# Patient Record
Sex: Female | Born: 1975 | Race: Black or African American | Hispanic: No | Smoking: Never smoker
Health system: Southern US, Community
[De-identification: ages and names within clinical notes are randomized; demographics above are authoritative.]

## PROBLEM LIST (undated history)

## (undated) ENCOUNTER — Inpatient Hospital Stay (HOSPITAL_COMMUNITY): Payer: Self-pay

## (undated) DIAGNOSIS — K219 Gastro-esophageal reflux disease without esophagitis: Secondary | ICD-10-CM

## (undated) DIAGNOSIS — G43909 Migraine, unspecified, not intractable, without status migrainosus: Secondary | ICD-10-CM

## (undated) DIAGNOSIS — F410 Panic disorder [episodic paroxysmal anxiety] without agoraphobia: Secondary | ICD-10-CM

## (undated) DIAGNOSIS — B009 Herpesviral infection, unspecified: Secondary | ICD-10-CM

## (undated) DIAGNOSIS — D649 Anemia, unspecified: Secondary | ICD-10-CM

## (undated) DIAGNOSIS — J3081 Allergic rhinitis due to animal (cat) (dog) hair and dander: Secondary | ICD-10-CM

## (undated) DIAGNOSIS — J40 Bronchitis, not specified as acute or chronic: Secondary | ICD-10-CM

## (undated) DIAGNOSIS — F32A Depression, unspecified: Secondary | ICD-10-CM

## (undated) DIAGNOSIS — D491 Neoplasm of unspecified behavior of respiratory system: Secondary | ICD-10-CM

## (undated) DIAGNOSIS — Z9109 Other allergy status, other than to drugs and biological substances: Secondary | ICD-10-CM

## (undated) HISTORY — DX: Depression, unspecified: F32.A

## (undated) HISTORY — DX: Other allergy status, other than to drugs and biological substances: Z91.09

## (undated) HISTORY — DX: Migraine, unspecified, not intractable, without status migrainosus: G43.909

## (undated) HISTORY — DX: Neoplasm of unspecified behavior of respiratory system: D49.1

## (undated) HISTORY — DX: Gastro-esophageal reflux disease without esophagitis: K21.9

## (undated) HISTORY — DX: Allergic rhinitis due to animal (cat) (dog) hair and dander: J30.81

---

## 2009-01-12 DIAGNOSIS — D491 Neoplasm of unspecified behavior of respiratory system: Secondary | ICD-10-CM

## 2009-01-12 HISTORY — PX: TUMOR REMOVAL: SHX12

## 2009-01-12 HISTORY — DX: Neoplasm of unspecified behavior of respiratory system: D49.1

## 2011-06-23 ENCOUNTER — Emergency Department (HOSPITAL_COMMUNITY)
Admission: EM | Admit: 2011-06-23 | Discharge: 2011-06-24 | Disposition: A | Discharge status: Home / Self Care | Payer: Self-pay | Attending: Emergency Medicine | Admitting: Emergency Medicine

## 2011-06-23 ENCOUNTER — Encounter (HOSPITAL_COMMUNITY): Payer: Self-pay | Admitting: *Deleted

## 2011-06-23 DIAGNOSIS — R10819 Abdominal tenderness, unspecified site: Secondary | ICD-10-CM | POA: Insufficient documentation

## 2011-06-23 DIAGNOSIS — K5900 Constipation, unspecified: Secondary | ICD-10-CM | POA: Insufficient documentation

## 2011-06-23 DIAGNOSIS — D649 Anemia, unspecified: Secondary | ICD-10-CM | POA: Insufficient documentation

## 2011-06-23 DIAGNOSIS — R109 Unspecified abdominal pain: Secondary | ICD-10-CM | POA: Insufficient documentation

## 2011-06-23 DIAGNOSIS — K625 Hemorrhage of anus and rectum: Secondary | ICD-10-CM | POA: Insufficient documentation

## 2011-06-23 HISTORY — DX: Anemia, unspecified: D64.9

## 2011-06-23 NOTE — ED Notes (Signed)
Pt reports dark red blood noted in stools - pt reports moderate/large amt noted x4 days. Pt admits to LUQ abd pain. Pt in no acute distress at present. Pt reports recent hx of constipation states last episode lasted x3 weeks.

## 2011-06-24 ENCOUNTER — Emergency Department (HOSPITAL_COMMUNITY): Payer: Self-pay

## 2011-06-24 ENCOUNTER — Encounter (HOSPITAL_COMMUNITY): Payer: Self-pay | Admitting: Radiology

## 2011-06-24 LAB — COMPREHENSIVE METABOLIC PANEL
Alkaline Phosphatase: 65 U/L (ref 39–117)
BUN: 12 mg/dL (ref 6–23)
Chloride: 103 mEq/L (ref 96–112)
Creatinine, Ser: 0.74 mg/dL (ref 0.50–1.10)
GFR calc Af Amer: >90 mL/min (ref >90)
Glucose, Bld: 99 mg/dL (ref 70–99)
Potassium: 3.6 mEq/L (ref 3.5–5.1)
Total Bilirubin: 0.2 mg/dL — ABNORMAL LOW (ref 0.3–1.2)
Total Protein: 7.7 g/dL (ref 6.0–8.3)

## 2011-06-24 LAB — CBC
MCH: 25.1 pg — ABNORMAL LOW (ref 26.0–34.0)
MCHC: 31.9 g/dL (ref 30.0–36.0)
MCV: 78.8 fL (ref 78.0–100.0)
Platelets: 257 10*3/uL (ref 150–400)
RBC: 4.1 MIL/uL (ref 3.87–5.11)

## 2011-06-24 LAB — URINALYSIS, ROUTINE W REFLEX MICROSCOPIC
Ketones, ur: NEGATIVE mg/dL
Leukocytes, UA: NEGATIVE
Nitrite: NEGATIVE
Protein, ur: NEGATIVE mg/dL

## 2011-06-24 LAB — DIFFERENTIAL
Basophils Relative: 0 % (ref 0–1)
Eosinophils Absolute: 0.1 10*3/uL (ref 0.0–0.7)
Eosinophils Relative: 2 % (ref 0–5)
Lymphs Abs: 3.3 10*3/uL (ref 0.7–4.0)
Neutrophils Relative %: 44 % (ref 43–77)

## 2011-06-24 LAB — APTT: aPTT: 32 seconds (ref 24–37)

## 2011-06-24 MED ORDER — POLYETHYLENE GLYCOL 3350 17 GM/SCOOP PO POWD: 17.0000 g | Freq: Two times a day (BID) | ORAL | Status: AC

## 2011-06-24 MED ORDER — OXYCODONE-ACETAMINOPHEN 5-325 MG PO TABS
1.0000 | ORAL_TABLET | Freq: Once | ORAL | Status: AC
  Administered 2011-06-24: 1 via ORAL
  Filled 2011-06-24: qty 1

## 2011-06-24 MED ORDER — IOHEXOL 300 MG/ML  SOLN
100.0000 mL | Freq: Once | INTRAMUSCULAR | Status: AC | PRN
  Administered 2011-06-24: 100 mL via INTRAVENOUS

## 2011-06-24 MED ORDER — MAGNESIUM CITRATE PO SOLN: 296.0000 mL | Freq: Once | ORAL | Status: AC

## 2011-06-24 MED ORDER — DOCUSATE SODIUM 100 MG PO CAPS: 100.0000 mg | ORAL_CAPSULE | Freq: Two times a day (BID) | ORAL | Status: AC

## 2011-06-24 NOTE — ED Notes (Signed)
Returned from CT.

## 2011-06-24 NOTE — ED Provider Notes (Signed)
History     CSN: [id number]  Arrival date & time 06/23/11  2213   First MD Initiated Contact with Patient 06/24/11 [phone]      Chief Complaint  Patient presents with  . Abdominal Pain  . Rectal Bleeding    (Consider location/radiation/quality/duration/timing/severity/associated sxs/prior treatment) HPI Comments: 36 year old female with no significant past medical history who presents with 4 days of left lower quadrant pain and rectal bleeding. She states that she has pain with bowel movements which is mild to moderate, minimal pain when not having a bowel movement. She denies a history of hemorrhoids, anticoagulation, trauma or anal penetration. Symptoms are intermittent, daily, nothing makes better or worse, no associated fevers chills nausea or vomiting. He has no dysuria, vaginal  Patient is a 36 y.o. female presenting with abdominal pain and hematochezia. The history is provided by the patient and a relative.  Abdominal Pain The primary symptoms of the illness include abdominal pain and hematochezia.  Rectal Bleeding  Associated symptoms include abdominal pain.    Past Medical History  Diagnosis Date  . Anemia     Past Surgical History  Procedure Date  . Tumor removal     No family history on file.  History  Substance Use Topics  . Smoking status: Never Smoker   . Smokeless tobacco: Not on file  . Alcohol Use: No    OB History    Grav Para Term Preterm Abortions TAB SAB Ect Mult Living                  Review of Systems  Gastrointestinal: Positive for abdominal pain and hematochezia.  All other systems reviewed and are negative.    Allergies  Review of patient's allergies indicates no known allergies.  Home Medications   Current Outpatient Rx  Name Route Sig Dispense Refill  . ONE-A-DAY WOMENS FORMULA PO Oral Take 1 tablet by mouth daily.    Marland Kitchen DOCUSATE SODIUM 100 MG PO CAPS Oral Take 1 capsule (100 mg total) by mouth every 12 (twelve) hours. 30 capsule  0  . MAGNESIUM CITRATE PO SOLN Oral Take 296 mLs by mouth once. OTC 300 mL 0  . POLYETHYLENE GLYCOL 3350 PO POWD Oral Take 17 g by mouth 2 (two) times daily. Until daily soft stools  OTC 255 g 0    BP 121/81  Pulse 67  Temp(Src) 98.7 F (37.1 C) (Oral)  Resp 18  SpO2 100%  LMP 06/14/2011  Physical Exam  Nursing note and vitals reviewed. Constitutional: She appears well-developed and well-nourished. No distress.  HENT:  Head: Normocephalic and atraumatic.  Mouth/Throat: Oropharynx is clear and moist. No oropharyngeal exudate.  Eyes: Conjunctivae and EOM are normal. Pupils are equal, round, and reactive to light. Right eye exhibits no discharge. Left eye exhibits no discharge. No scleral icterus.  Neck: Normal range of motion. Neck supple. No JVD present. No thyromegaly present.  Cardiovascular: Normal rate, regular rhythm, normal heart sounds and intact distal pulses.  Exam reveals no gallop and no friction rub.   No murmur heard. Pulmonary/Chest: Effort normal and breath sounds normal. No respiratory distress. She has no wheezes. She has no rales.  Abdominal: Soft. Bowel sounds are normal. She exhibits no distension and no mass. There is tenderness ( Mild tenderness to the left upper and lower quadrants. No right lower quadrant tenderness, no right upper quadrant tenderness. Non-peritoneal).  Genitourinary:       Chaperone present for rectal exam, no external or internal hemorrhoids, no  anal fissure, no masses felt. Scant stool in the rectal vault, no gross blood and Hemoccult  Musculoskeletal: Normal range of motion. She exhibits no edema and no tenderness.  Lymphadenopathy:    She has no cervical adenopathy.  Neurological: She is alert. Coordination normal.  Skin: Skin is warm and dry. No rash noted. No erythema.  Psychiatric: She has a normal mood and affect. Her behavior is normal.    ED Course  Procedures (including critical care time)  Labs Reviewed  CBC - Abnormal;  Notable for the following:    Hemoglobin 10.3 (*)    HCT 32.3 (*)    MCH 25.1 (*)    All other components within normal limits  DIFFERENTIAL - Abnormal; Notable for the following:    Lymphocytes Relative 47 (*)    All other components within normal limits  COMPREHENSIVE METABOLIC PANEL - Abnormal; Notable for the following:    Total Bilirubin 0.2 (*)    All other components within normal limits  APTT  PROTIME-INR  URINALYSIS, ROUTINE W REFLEX MICROSCOPIC  PREGNANCY, URINE  OCCULT BLOOD, POC DEVICE  OCCULT BLOOD X 1 CARD TO LAB, STOOL   Ct Abdomen Pelvis W Contrast  06/24/2011  *RADIOLOGY REPORT*  Clinical Data: Abdominal pain.  GI bleeding.  Rectal bleeding.  CT ABDOMEN AND PELVIS WITH CONTRAST  Technique:  Multidetector CT imaging of the abdomen and pelvis was performed following the standard protocol during bolus administration of intravenous contrast.  Contrast: OMNIPAQUE IOHEXOL 300 MG/ML IV SOLN  Comparison: None.  Findings: Lung bases clear.  Liver appears within normal limits. Gallbladder is mostly decompressed.  No inflammatory changes of the gallbladder.  Spleen normal.  Pancreas normal.  Common bile duct normal.  Abdominal vasculature appears within normal limits. Normal renal enhancement.  There is ectasia of the right upper pole renal collecting system and a prominent column of Bertin.  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  Delayed renal images were not performed per protocol.  No renal calculi.  Normal renal enhancement.  Small bowel appears within normal limits.  Normal appendix in the right lower quadrant.  Large stool burden is present in the ascending, transverse and proximal descending colon.  Sigmoid colon appears mostly decompressed.  Prom prominent veins are present in the anatomic pelvis around the uterus which could be associated with pelvic congestion syndrome.  IUD is present within the uterus. Urinary  bladder appears normal.  Scattered calcified phleboliths. No adenopathy is identified.  Bones appear within normal limits.  IMPRESSION: 1.  Large right-sided stool burden.  Normal appendix. 2.  IUD present within the uterus. 3.  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  No definite obstruction.  Original Report Authenticated By: Andreas Newport, M.D.     1. Abdominal pain   2. Anemia   3. Constipation       MDM  Left-sided pain with report of bleeding though no evidence of bleeding on exam. Vital signs unremarkable. Blood work and CT scan pending.  CT scan shows large stool burden, CBC shows mild anemia. I discussed the signs with the patient and encourage her to followup closely. Hemoccult testing was negative for blood. Referrals to        Vida Roller, MD 06/24/11 [phone]

## 2012-04-06 ENCOUNTER — Encounter: Payer: Self-pay | Admitting: Family Medicine

## 2012-06-17 ENCOUNTER — Encounter (HOSPITAL_COMMUNITY): Payer: Self-pay | Admitting: Emergency Medicine

## 2012-06-17 ENCOUNTER — Emergency Department (HOSPITAL_COMMUNITY)
Admission: EM | Admit: 2012-06-17 | Discharge: 2012-06-17 | Disposition: A | Discharge status: Home / Self Care | Payer: BC Managed Care – PPO | Attending: Emergency Medicine | Admitting: Emergency Medicine

## 2012-06-17 DIAGNOSIS — J029 Acute pharyngitis, unspecified: Secondary | ICD-10-CM | POA: Insufficient documentation

## 2012-06-17 DIAGNOSIS — Z862 Personal history of diseases of the blood and blood-forming organs and certain disorders involving the immune mechanism: Secondary | ICD-10-CM | POA: Insufficient documentation

## 2012-06-17 DIAGNOSIS — R630 Anorexia: Secondary | ICD-10-CM | POA: Insufficient documentation

## 2012-06-17 DIAGNOSIS — R6883 Chills (without fever): Secondary | ICD-10-CM | POA: Insufficient documentation

## 2012-06-17 DIAGNOSIS — R112 Nausea with vomiting, unspecified: Secondary | ICD-10-CM | POA: Insufficient documentation

## 2012-06-17 DIAGNOSIS — J111 Influenza due to unidentified influenza virus with other respiratory manifestations: Secondary | ICD-10-CM

## 2012-06-17 DIAGNOSIS — R52 Pain, unspecified: Secondary | ICD-10-CM | POA: Insufficient documentation

## 2012-06-17 DIAGNOSIS — Z8709 Personal history of other diseases of the respiratory system: Secondary | ICD-10-CM | POA: Insufficient documentation

## 2012-06-17 HISTORY — DX: Bronchitis, not specified as acute or chronic: J40

## 2012-06-17 MED ORDER — ACETAMINOPHEN-CODEINE 120-12 MG/5ML PO SOLN: 10.0000 mL | ORAL | Status: DC | PRN

## 2012-06-17 MED ORDER — IBUPROFEN 800 MG PO TABS: 800.0000 mg | ORAL_TABLET | Freq: Three times a day (TID) | ORAL | Status: DC | PRN

## 2012-06-17 MED ORDER — GUAIFENESIN ER 1200 MG PO TB12: 1.0000 | ORAL_TABLET | Freq: Two times a day (BID) | ORAL | Status: DC

## 2012-06-17 NOTE — ED Provider Notes (Signed)
History  This chart was scribed for Ebbie Ridge, PA-C working with Ward Givens, MD by Shari Heritage, ED Scribe. This patient was seen in room WTR5/WTR5 and the patient's care was started at 1502.   CSN: [id number]  Arrival date & time 06/17/12  1402   First MD Initiated Contact with Patient 06/17/12 1502      Chief Complaint  Patient presents with  . Sore Throat    painful swollowing  . Cough    productive cough  . Generalized Body Aches     The history is provided by the patient. No language interpreter was used.    HPI Comments: Dawn Gray is a 37 y.o. female who presents to the Emergency Department complaining of moderate to severe, constant, dull sore throat pain and productive cough onset 1 day ago. Patient states that cough is producing clear sputum. There is associated nausea, generalized body aches, chills, lightheadedness, emesis (x2), and decreased appetite.  Patient denies diarrhea, abdominal pain, dizziness, or blurred vision. Patient hasn't taken any medicines at home for symptom relief. Patient says that she has only been able to drink water and that other fluids hurt her throat. Patient is not allergic to any medicines. She does not take any regular medicines at home. Patient has a medical history of anemia and bronchitis.   PCP - Rana Snare   Past Medical History  Diagnosis Date  . Anemia   . Bronchitis     Past Surgical History  Procedure Date  . Tumor removal     Family History  Problem Relation Age of Onset  . Hypertension Mother   . Diabetes Mother   . Cancer Father   . Diabetes Father   . Hypertension Father     History  Substance Use Topics  . Smoking status: Never Smoker   . Smokeless tobacco: Not on file  . Alcohol Use: No    OB History    Grav Para Term Preterm Abortions TAB SAB Ect Mult Living                  Review of Systems All other systems negative except as documented in the HPI. All pertinent positives and negatives as reviewed  in the HPI.  Allergies  Review of patient's allergies indicates no known allergies.  Home Medications   Current Outpatient Rx  Name  Route  Sig  Dispense  Refill  . ONE-A-DAY WOMENS FORMULA PO   Oral   Take 1 tablet by mouth daily.           Triage Vtials: BP 118/75  Pulse 96  Temp 98.1 F (36.7 C) (Oral)  Resp 18  SpO2 99%  LMP 06/13/2012  Physical Exam  Constitutional: She is oriented to person, place, and time. She appears well-developed and well-nourished. No distress.  HENT:  Head: Normocephalic and atraumatic.  Mouth/Throat: No oropharyngeal exudate.  Eyes: Conjunctivae normal are normal.  Neck: Neck supple.  Cardiovascular: Normal rate and regular rhythm.   No murmur heard. Pulmonary/Chest: Effort normal and breath sounds normal. No respiratory distress. She has no wheezes. She has no rales.  Abdominal: She exhibits no distension.  Lymphadenopathy:    She has cervical adenopathy.  Neurological: She is alert and oriented to person, place, and time.  Skin: Skin is warm. No rash noted.  Psychiatric: She has a normal mood and affect. Her behavior is normal.    ED Course  Procedures (including critical care time) DIAGNOSTIC STUDIES: Oxygen Saturation is 99%  on room air, normal by my interpretation.    COORDINATION OF CARE: 3:26 PM- Patient's here with sore throat cough and other flu-like symptoms. Patient's symptoms are consistent with flu-like illness. Upon exam, lungs were clear suggesting that patient does not have any pneumonia. Recommend that patient take medicines for symptom relief and let virus run its course. Patient should return for any worsening symptoms. The patient is advised to increase her fluid intake and rest as much as possible.     MDM  I personally performed the services described in this documentation, which was scribed in my presence. The recorded information has been reviewed and is accurate.   Carlyle Dolly, PA-C 06/17/12  1549

## 2012-06-17 NOTE — ED Provider Notes (Signed)
Medical screening examination/treatment/procedure(s) were performed by non-physician practitioner and as supervising physician I was immediately available for consultation/collaboration. Devoria Albe, MD, Arm[facility] Gang   Ward Givens, MD 06/17/12 [phone]

## 2012-06-17 NOTE — ED Notes (Signed)
Pt reports generalized aches, poor appetite, cough -with emesis x2.. Sore throat x 3 days

## 2012-07-13 ENCOUNTER — Encounter: Payer: Self-pay | Admitting: Family Medicine

## 2012-07-13 ENCOUNTER — Other Ambulatory Visit (HOSPITAL_COMMUNITY)
Admission: RE | Admit: 2012-07-13 | Discharge: 2012-07-13 | Disposition: A | Discharge status: Home / Self Care | Payer: BC Managed Care – PPO | Source: Ambulatory Visit | Attending: Family Medicine | Admitting: Family Medicine

## 2012-07-13 ENCOUNTER — Ambulatory Visit (INDEPENDENT_AMBULATORY_CARE_PROVIDER_SITE_OTHER): Payer: BC Managed Care – PPO | Admitting: Family Medicine

## 2012-07-13 VITALS — BP 90/60 | HR 91 | Temp 98.4°F | Ht 63.0 in | Wt 144.8 lb

## 2012-07-13 DIAGNOSIS — Z0000 Encounter for general adult medical examination without abnormal findings: Secondary | ICD-10-CM

## 2012-07-13 DIAGNOSIS — Z01419 Encounter for gynecological examination (general) (routine) without abnormal findings: Secondary | ICD-10-CM | POA: Insufficient documentation

## 2012-07-13 DIAGNOSIS — K5900 Constipation, unspecified: Secondary | ICD-10-CM

## 2012-07-13 DIAGNOSIS — K5909 Other constipation: Secondary | ICD-10-CM | POA: Insufficient documentation

## 2012-07-13 LAB — BASIC METABOLIC PANEL
BUN: 15 mg/dL (ref 6–23)
Calcium: 9.6 mg/dL (ref 8.4–10.5)
Creatinine, Ser: 0.8 mg/dL (ref 0.4–1.2)
GFR: 106.85 mL/min (ref >60.00)
Glucose, Bld: 92 mg/dL (ref 70–99)

## 2012-07-13 LAB — CBC WITH DIFFERENTIAL/PLATELET
Basophils Absolute: 0 10*3/uL (ref 0.0–0.1)
Lymphocytes Relative: 29 % (ref 12.0–46.0)
Lymphs Abs: 1.9 10*3/uL (ref 0.7–4.0)
Monocytes Relative: 4.9 % (ref 3.0–12.0)
Neutrophils Relative %: 62.6 % (ref 43.0–77.0)
Platelets: 270 10*3/uL (ref 150.0–400.0)
RDW: 14.3 % (ref 11.5–14.6)

## 2012-07-13 LAB — LIPID PANEL
Cholesterol: 173 mg/dL (ref 0–200)
LDL Cholesterol: 111 mg/dL — ABNORMAL HIGH (ref 0–99)
VLDL: 15.4 mg/dL (ref 0.0–40.0)

## 2012-07-13 LAB — TSH: TSH: 2.24 u[IU]/mL (ref 0.35–5.50)

## 2012-07-13 LAB — HEPATIC FUNCTION PANEL
AST: 15 U/L (ref 0–37)
Alkaline Phosphatase: 61 U/L (ref 39–117)
Total Bilirubin: 0.6 mg/dL (ref 0.3–1.2)

## 2012-07-13 MED ORDER — LINACLOTIDE 145 MCG PO CAPS: 145.0000 ug | ORAL_CAPSULE | Freq: Every day | ORAL | Status: DC

## 2012-07-13 NOTE — Assessment & Plan Note (Addendum)
GI referral Pt has tried multiple otc with no relief----trial Linzess 145 1 po qd

## 2012-07-13 NOTE — Patient Instructions (Addendum)
Preventive Care for Adults, Female A healthy lifestyle and preventive care can promote health and wellness. Preventive health guidelines for women include the following key practices.  A routine yearly physical is a good way to check with your caregiver about your health and preventive screening. It is a chance to share any concerns and updates on your health, and to receive a thorough exam.  Visit your dentist for a routine exam and preventive care every 6 months. Brush your teeth twice a day and floss once a day. Good oral hygiene prevents tooth decay and gum disease.  The frequency of eye exams is based on your age, health, family medical history, use of contact lenses, and other factors. Follow your caregiver's recommendations for frequency of eye exams.  Eat a healthy diet. Foods like vegetables, fruits, whole grains, low-fat dairy products, and lean protein foods contain the nutrients you need without too many calories. Decrease your intake of foods high in solid fats, added sugars, and salt. Eat the right amount of calories for you.Get information about a proper diet from your caregiver, if necessary.  Regular physical exercise is one of the most important things you can do for your health. Most adults should get at least 150 minutes of moderate-intensity exercise (any activity that increases your heart rate and causes you to sweat) each week. In addition, most adults need muscle-strengthening exercises on 2 or more days a week.  Maintain a healthy weight. The body mass index (BMI) is a screening tool to identify possible weight problems. It provides an estimate of body fat based on height and weight. Your caregiver can help determine your BMI, and can help you achieve or maintain a healthy weight.For adults 20 years and older:  A BMI below 18.5 is considered underweight.  A BMI of 18.5 to 24.9 is normal.  A BMI of 25 to 29.9 is considered overweight.  A BMI of 30 and above is  considered obese.  Maintain normal blood lipids and cholesterol levels by exercising and minimizing your intake of saturated fat. Eat a balanced diet with plenty of fruit and vegetables. Blood tests for lipids and cholesterol should begin at age 20 and be repeated every 5 years. If your lipid or cholesterol levels are high, you are over 50, or you are at high risk for heart disease, you may need your cholesterol levels checked more frequently.Ongoing high lipid and cholesterol levels should be treated with medicines if diet and exercise are not effective.  If you smoke, find out from your caregiver how to quit. If you do not use tobacco, do not start.  If you are pregnant, do not drink alcohol. If you are breastfeeding, be very cautious about drinking alcohol. If you are not pregnant and choose to drink alcohol, do not exceed 1 drink per day. One drink is considered to be 12 ounces (355 mL) of beer, 5 ounces (148 mL) of wine, or 1.5 ounces (44 mL) of liquor.  Avoid use of street drugs. Do not share needles with anyone. Ask for help if you need support or instructions about stopping the use of drugs.  High blood pressure causes heart disease and increases the risk of stroke. Your blood pressure should be checked at least every 1 to 2 years. Ongoing high blood pressure should be treated with medicines if weight loss and exercise are not effective.  If you are 55 to 35 years old, ask your caregiver if you should take aspirin to prevent strokes.  Diabetes   screening involves taking a blood sample to check your fasting blood sugar level. This should be done once every 3 years, after age 45, if you are within normal weight and without risk factors for diabetes. Testing should be considered at a younger age or be carried out more frequently if you are overweight and have at least 1 risk factor for diabetes.  Breast cancer screening is essential preventive care for women. You should practice "breast  self-awareness." This means understanding the normal appearance and feel of your breasts and may include breast self-examination. Any changes detected, no matter how small, should be reported to a caregiver. Women in their 20s and 30s should have a clinical breast exam (CBE) by a caregiver as part of a regular health exam every 1 to 3 years. After age 40, women should have a CBE every year. Starting at age 40, women should consider having a mammography (breast X-ray test) every year. Women who have a family history of breast cancer should talk to their caregiver about genetic screening. Women at a high risk of breast cancer should talk to their caregivers about having magnetic resonance imaging (MRI) and a mammography every year.  The Pap test is a screening test for cervical cancer. A Pap test can show cell changes on the cervix that might become cervical cancer if left untreated. A Pap test is a procedure in which cells are obtained and examined from the lower end of the uterus (cervix).  Women should have a Pap test starting at age 21.  Between ages 21 and 29, Pap tests should be repeated every 2 years.  Beginning at age 30, you should have a Pap test every 3 years as long as the past 3 Pap tests have been normal.  Some women have medical problems that increase the chance of getting cervical cancer. Talk to your caregiver about these problems. It is especially important to talk to your caregiver if a new problem develops soon after your last Pap test. In these cases, your caregiver may recommend more frequent screening and Pap tests.  The above recommendations are the same for women who have or have not gotten the vaccine for human papillomavirus (HPV).  If you had a hysterectomy for a problem that was not cancer or a condition that could lead to cancer, then you no longer need Pap tests. Even if you no longer need a Pap test, a regular exam is a good idea to make sure no other problems are  starting.  If you are between ages 65 and 70, and you have had normal Pap tests going back 10 years, you no longer need Pap tests. Even if you no longer need a Pap test, a regular exam is a good idea to make sure no other problems are starting.  If you have had past treatment for cervical cancer or a condition that could lead to cancer, you need Pap tests and screening for cancer for at least 20 years after your treatment.  If Pap tests have been discontinued, risk factors (such as a new sexual partner) need to be reassessed to determine if screening should be resumed.  The HPV test is an additional test that may be used for cervical cancer screening. The HPV test looks for the virus that can cause the cell changes on the cervix. The cells collected during the Pap test can be tested for HPV. The HPV test could be used to screen women aged 30 years and older, and should   be used in women of any age who have unclear Pap test results. After the age of 30, women should have HPV testing at the same frequency as a Pap test.  Colorectal cancer can be detected and often prevented. Most routine colorectal cancer screening begins at the age of 50 and continues through age 75. However, your caregiver may recommend screening at an earlier age if you have risk factors for colon cancer. On a yearly basis, your caregiver may provide home test kits to check for hidden blood in the stool. Use of a small camera at the end of a tube, to directly examine the colon (sigmoidoscopy or colonoscopy), can detect the earliest forms of colorectal cancer. Talk to your caregiver about this at age 50, when routine screening begins. Direct examination of the colon should be repeated every 5 to 10 years through age 75, unless early forms of pre-cancerous polyps or small growths are found.  Hepatitis C blood testing is recommended for all people born from 1945 through 1965 and any individual with known risks for hepatitis C.  Practice  safe sex. Use condoms and avoid high-risk sexual practices to reduce the spread of sexually transmitted infections (STIs). STIs include gonorrhea, chlamydia, syphilis, trichomonas, herpes, HPV, and human immunodeficiency virus (HIV). Herpes, HIV, and HPV are viral illnesses that have no cure. They can result in disability, cancer, and death. Sexually active women aged 25 and younger should be checked for chlamydia. Older women with new or multiple partners should also be tested for chlamydia. Testing for other STIs is recommended if you are sexually active and at increased risk.  Osteoporosis is a disease in which the bones lose minerals and strength with aging. This can result in serious bone fractures. The risk of osteoporosis can be identified using a bone density scan. Women ages 65 and over and women at risk for fractures or osteoporosis should discuss screening with their caregivers. Ask your caregiver whether you should take a calcium supplement or vitamin D to reduce the rate of osteoporosis.  Menopause can be associated with physical symptoms and risks. Hormone replacement therapy is available to decrease symptoms and risks. You should talk to your caregiver about whether hormone replacement therapy is right for you.  Use sunscreen with sun protection factor (SPF) of 30 or more. Apply sunscreen liberally and repeatedly throughout the day. You should seek shade when your shadow is shorter than you. Protect yourself by wearing long sleeves, pants, a wide-brimmed hat, and sunglasses year round, whenever you are outdoors.  Once a month, do a whole body skin exam, using a mirror to look at the skin on your back. Notify your caregiver of new moles, moles that have irregular borders, moles that are larger than a pencil eraser, or moles that have changed in shape or color.  Stay current with required immunizations.  Influenza. You need a dose every fall (or winter). The composition of the flu vaccine  changes each year, so being vaccinated once is not enough.  Pneumococcal polysaccharide. You need 1 to 2 doses if you smoke cigarettes or if you have certain chronic medical conditions. You need 1 dose at age 65 (or older) if you have never been vaccinated.  Tetanus, diphtheria, pertussis (Tdap, Td). Get 1 dose of Tdap vaccine if you are younger than age 65, are over 65 and have contact with an infant, are a healthcare worker, are pregnant, or simply want to be protected from whooping cough. After that, you need a Td   booster dose every 10 years. Consult your caregiver if you have not had at least 3 tetanus and diphtheria-containing shots sometime in your life or have a deep or dirty wound.  HPV. You need this vaccine if you are a woman age 26 or younger. The vaccine is given in 3 doses over 6 months.  Measles, mumps, rubella (MMR). You need at least 1 dose of MMR if you were born in 1957 or later. You may also need a second dose.  Meningococcal. If you are age 19 to 21 and a first-year college student living in a residence hall, or have one of several medical conditions, you need to get vaccinated against meningococcal disease. You may also need additional booster doses.  Zoster (shingles). If you are age 60 or older, you should get this vaccine.  Varicella (chickenpox). If you have never had chickenpox or you were vaccinated but received only 1 dose, talk to your caregiver to find out if you need this vaccine.  Hepatitis A. You need this vaccine if you have a specific risk factor for hepatitis A virus infection or you simply wish to be protected from this disease. The vaccine is usually given as 2 doses, 6 to 18 months apart.  Hepatitis B. You need this vaccine if you have a specific risk factor for hepatitis B virus infection or you simply wish to be protected from this disease. The vaccine is given in 3 doses, usually over 6 months. Preventive Services / Frequency Ages 19 to 39  Blood  pressure check.** / Every 1 to 2 years.  Lipid and cholesterol check.** / Every 5 years beginning at age 20.  Clinical breast exam.** / Every 3 years for women in their 20s and 30s.  Pap test.** / Every 2 years from ages 21 through 29. Every 3 years starting at age 30 through age 65 or 70 with a history of 3 consecutive normal Pap tests.  HPV screening.** / Every 3 years from ages 30 through ages 65 to 70 with a history of 3 consecutive normal Pap tests.  Hepatitis C blood test.** / For any individual with known risks for hepatitis C.  Skin self-exam. / Monthly.  Influenza immunization.** / Every year.  Pneumococcal polysaccharide immunization.** / 1 to 2 doses if you smoke cigarettes or if you have certain chronic medical conditions.  Tetanus, diphtheria, pertussis (Tdap, Td) immunization. / A one-time dose of Tdap vaccine. After that, you need a Td booster dose every 10 years.  HPV immunization. / 3 doses over 6 months, if you are 26 and younger.  Measles, mumps, rubella (MMR) immunization. / You need at least 1 dose of MMR if you were born in 1957 or later. You may also need a second dose.  Meningococcal immunization. / 1 dose if you are age 19 to 21 and a first-year college student living in a residence hall, or have one of several medical conditions, you need to get vaccinated against meningococcal disease. You may also need additional booster doses.  Varicella immunization.** / Consult your caregiver.  Hepatitis A immunization.** / Consult your caregiver. 2 doses, 6 to 18 months apart.  Hepatitis B immunization.** / Consult your caregiver. 3 doses usually over 6 months. Ages 40 to 64  Blood pressure check.** / Every 1 to 2 years.  Lipid and cholesterol check.** / Every 5 years beginning at age 20.  Clinical breast exam.** / Every year after age 40.  Mammogram.** / Every year beginning at age 40   and continuing for as long as you are in good health. Consult with your  caregiver.  Pap test.** / Every 3 years starting at age 30 through age 65 or 70 with a history of 3 consecutive normal Pap tests.  HPV screening.** / Every 3 years from ages 30 through ages 65 to 70 with a history of 3 consecutive normal Pap tests.  Fecal occult blood test (FOBT) of stool. / Every year beginning at age 50 and continuing until age 75. You may not need to do this test if you get a colonoscopy every 10 years.  Flexible sigmoidoscopy or colonoscopy.** / Every 5 years for a flexible sigmoidoscopy or every 10 years for a colonoscopy beginning at age 50 and continuing until age 75.  Hepatitis C blood test.** / For all people born from 1945 through 1965 and any individual with known risks for hepatitis C.  Skin self-exam. / Monthly.  Influenza immunization.** / Every year.  Pneumococcal polysaccharide immunization.** / 1 to 2 doses if you smoke cigarettes or if you have certain chronic medical conditions.  Tetanus, diphtheria, pertussis (Tdap, Td) immunization.** / A one-time dose of Tdap vaccine. After that, you need a Td booster dose every 10 years.  Measles, mumps, rubella (MMR) immunization. / You need at least 1 dose of MMR if you were born in 1957 or later. You may also need a second dose.  Varicella immunization.** / Consult your caregiver.  Meningococcal immunization.** / Consult your caregiver.  Hepatitis A immunization.** / Consult your caregiver. 2 doses, 6 to 18 months apart.  Hepatitis B immunization.** / Consult your caregiver. 3 doses, usually over 6 months. Ages 65 and over  Blood pressure check.** / Every 1 to 2 years.  Lipid and cholesterol check.** / Every 5 years beginning at age 20.  Clinical breast exam.** / Every year after age 40.  Mammogram.** / Every year beginning at age 40 and continuing for as long as you are in good health. Consult with your caregiver.  Pap test.** / Every 3 years starting at age 30 through age 65 or 70 with a 3  consecutive normal Pap tests. Testing can be stopped between 65 and 70 with 3 consecutive normal Pap tests and no abnormal Pap or HPV tests in the past 10 years.  HPV screening.** / Every 3 years from ages 30 through ages 65 or 70 with a history of 3 consecutive normal Pap tests. Testing can be stopped between 65 and 70 with 3 consecutive normal Pap tests and no abnormal Pap or HPV tests in the past 10 years.  Fecal occult blood test (FOBT) of stool. / Every year beginning at age 50 and continuing until age 75. You may not need to do this test if you get a colonoscopy every 10 years.  Flexible sigmoidoscopy or colonoscopy.** / Every 5 years for a flexible sigmoidoscopy or every 10 years for a colonoscopy beginning at age 50 and continuing until age 75.  Hepatitis C blood test.** / For all people born from 1945 through 1965 and any individual with known risks for hepatitis C.  Osteoporosis screening.** / A one-time screening for women ages 65 and over and women at risk for fractures or osteoporosis.  Skin self-exam. / Monthly.  Influenza immunization.** / Every year.  Pneumococcal polysaccharide immunization.** / 1 dose at age 65 (or older) if you have never been vaccinated.  Tetanus, diphtheria, pertussis (Tdap, Td) immunization. / A one-time dose of Tdap vaccine if you are over   65 and have contact with an infant, are a Research scientist (physical sciences), or simply want to be protected from whooping cough. After that, you need a Td booster dose every 10 years.  Varicella immunization.** / Consult your caregiver.  Meningococcal immunization.** / Consult your caregiver.  Hepatitis A immunization.** / Consult your caregiver. 2 doses, 6 to 18 months apart.  Hepatitis B immunization.** / Check with your caregiver. 3 doses, usually over 6 months. ** Family history and personal history of risk and conditions may change your caregiver's recommendations. Document Released: 07/27/2001 Document Revised: 08/23/2011  Document Reviewed: 10/26/2010 [facility] Patient Information 2013 [place], [place].  Constipation, Adult Constipation is when a person has fewer than 3 bowel movements a week; has difficulty having a bowel movement; or has stools that are dry, hard, or larger than normal. As people grow older, constipation is more common. If you try to fix constipation with medicines that make you have a bowel movement (laxatives), the problem may get worse. Long-term laxative use may cause the muscles of the colon to become weak. A low-fiber diet, not taking in enough fluids, and taking certain medicines may make constipation worse. CAUSES   Certain medicines, such as antidepressants, pain medicine, iron supplements, antacids, and water pills.   Certain diseases, such as diabetes, irritable bowel syndrome (IBS), thyroid disease, or depression.   Not drinking enough water.   Not eating enough fiber-rich foods.   Stress or travel.  Lack of physical activity or exercise.  Not going to the restroom when there is the urge to have a bowel movement.  Ignoring the urge to have a bowel movement.  Using laxatives too much. SYMPTOMS   Having fewer than 3 bowel movements a week.   Straining to have a bowel movement.   Having hard, dry, or larger than normal stools.   Feeling full or bloated.   Pain in the lower abdomen.  Not feeling relief after having a bowel movement. DIAGNOSIS  Your caregiver will take a medical history and perform a physical exam. Further testing may be done for severe constipation. Some tests may include:   A barium enema X-ray to examine your rectum, colon, and sometimes, your small intestine.  A sigmoidoscopy to examine your lower colon.  A colonoscopy to examine your entire colon. TREATMENT  Treatment will depend on the severity of your constipation and what is causing it. Some dietary treatments include drinking more fluids and eating more fiber-rich foods. Lifestyle  treatments may include regular exercise. If these diet and lifestyle recommendations do not help, your caregiver may recommend taking over-the-counter laxative medicines to help you have bowel movements. Prescription medicines may be prescribed if over-the-counter medicines do not work.  HOME CARE INSTRUCTIONS   Increase dietary fiber in your diet, such as fruits, vegetables, whole grains, and beans. Limit high-fat and processed sugars in your diet, such as [place] fries, hamburgers, cookies, candies, and soda.   A fiber supplement may be added to your diet if you cannot get enough fiber from foods.   Drink enough fluids to keep your urine clear or pale yellow.   Exercise regularly or as directed by your caregiver.   Go to the restroom when you have the urge to go. Do not hold it.  Only take medicines as directed by your caregiver. Do not take other medicines for constipation without talking to your caregiver first. SEEK IMMEDIATE MEDICAL CARE IF:   You have bright red blood in your stool.   Your constipation lasts for  more than 4 days or gets worse.   You have abdominal or rectal pain.   You have thin, pencil-like stools.  You have unexplained weight loss. MAKE SURE YOU:   Understand these instructions.  Will watch your condition.  Will get help right away if you are not doing well or get worse. Document Released: 02/27/2004 Document Revised: 08/23/2011 Document Reviewed: 05/04/2011 [facility] Patient Information 2013 [place], [place].

## 2012-07-13 NOTE — Progress Notes (Signed)
Subjective:     Dawn Gray is a 37 y.o. female and is here for a comprehensive physical exam. The patient reports problems - chronic constipation.--- x a few years.    She has used several otc remedies with no relief.  --- including stool softeners, prune juice , miralax, mag citrate, etc.      History   Social History  . Marital Status: Single    Spouse Name: N/A    Number of Children: N/A  . Years of Education: N/A   Occupational History  . Not on file.   Social History Main Topics  . Smoking status: Never Smoker   . Smokeless tobacco: Not on file  . Alcohol Use: No  . Drug Use: No  . Sexually Active: Yes -- Female partner(s)    Birth Control/ Protection: IUD   Other Topics Concern  . Not on file   Social History Narrative  . No narrative on file   Health Maintenance  Topic Date Due  . Pap Smear  01/28/1996  . Tetanus/tdap  01/27/1997  . Influenza Vaccine  02/13/2012    The following portions of the patient's history were reviewed and updated as appropriate:  She  has a past medical history of Anemia; Bronchitis; GERD (gastroesophageal reflux disease); Environmental allergies; Cat allergies; Migraine; and Lung tumor (01/2009). She  does not have a problem list on file. She  has past surgical history that includes Tumor removal (01/2009). Her family history includes Arthritis in her mother and paternal grandmother; Cancer in her maternal grandfather and paternal grandmother; Diabetes in her father and mother; Luiz Blare' disease in her father; Heart disease in her maternal uncle; Hyperlipidemia in her father, maternal grandfather, paternal grandfather, and paternal grandmother; Hypertension in her father and mother; and Stroke in her maternal grandmother and paternal grandmother. She  reports that she has never smoked. She does not have any smokeless tobacco history on file. She reports that she does not drink alcohol or use illicit drugs. She currently has no medications in their  medication list. No current outpatient prescriptions on file prior to visit.   She  has no known allergies..  Review of Systems Review of Systems  Constitutional: Negative for activity change, appetite change and fatigue.  HENT: Negative for hearing loss, congestion, tinnitus and ear discharge.  dentist q35m Eyes: Negative for visual disturbance (see optho q1y -- vision corrected to 20/20 with glasses).  Respiratory: Negative for cough, chest tightness and shortness of breath.   Cardiovascular: Negative for chest pain, palpitations and leg swelling.  Gastrointestinal: Negative for abdominal pain,  + constipation Genitourinary: Negative for urgency, frequency, decreased urine volume and difficulty urinating.  Musculoskeletal: Negative for back pain, arthralgias and gait problem.  Skin: Negative for color change, pallor and rash.  Neurological: Negative for dizziness, light-headedness, numbness and headaches.  Hematological: Negative for adenopathy. Does not bruise/bleed easily.  Psychiatric/Behavioral: Negative for suicidal ideas, confusion, sleep disturbance, self-injury, dysphoric mood, decreased concentration and agitation.       Objective:    BP 90/60  Pulse 91  Temp 98.4 F (36.9 C) (Oral)  Ht 5\' 3"  (1.6 m)  Wt 144 lb 12.8 oz (65.681 kg)  BMI 25.65 kg/m2  SpO2 99%  LMP 07/06/2012 General appearance: alert, cooperative, appears stated age and no distress Head: Normocephalic, without obvious abnormality, atraumatic Eyes: conjunctivae/corneas clear. PERRL, EOM's intact. Fundi benign. Ears: normal TM's and external ear canals both ears Nose: Nares normal. Septum midline. Mucosa normal. No drainage or sinus  tenderness. Throat: lips, mucosa, and tongue normal; teeth and gums normal Neck: no adenopathy, no carotid bruit, supple, symmetrical, trachea midline and thyroid not enlarged, symmetric, no tenderness/mass/nodules Back: symmetric, no curvature. ROM normal. No CVA  tenderness. Lungs: clear to auscultation bilaterally Breasts: normal appearance, no masses or tenderness Heart: regular rate and rhythm, S1, S2 normal, no murmur, click, rub or gallop Abdomen: soft, non-tender; bowel sounds normal; no masses,  no organomegaly Pelvic: cervix normal in appearance, external genitalia normal, no adnexal masses or tenderness, no cervical motion tenderness, rectovaginal septum normal, uterus normal size, shape, and consistency and vagina normal without discharge-- pap done,  IUD string visualized Extremities: extremities normal, atraumatic, no cyanosis or edema Pulses: 2+ and symmetric Skin: Skin color, texture, turgor normal. No rashes or lesions Lymph nodes: Cervical, supraclavicular, and axillary nodes normal. Neurologic: Alert and oriented X 3, normal strength and tone. Normal symmetric reflexes. Normal coordination and gait psych-- no depression, no anxiety   rectal-- heme neg brown stool Assessment:    Healthy female exam.      Plan:    ghm utd Check labs See After Visit Summary for Counseling Recommendations

## 2012-07-14 ENCOUNTER — Encounter: Payer: Self-pay | Admitting: Gastroenterology

## 2012-07-14 LAB — POCT URINALYSIS DIPSTICK
Blood, UA: NEGATIVE
Protein, UA: NEGATIVE
Spec Grav, UA: >=1.03
Urobilinogen, UA: 0.2

## 2012-07-18 ENCOUNTER — Encounter: Payer: Self-pay | Admitting: Gastroenterology

## 2012-07-18 ENCOUNTER — Ambulatory Visit (INDEPENDENT_AMBULATORY_CARE_PROVIDER_SITE_OTHER): Payer: BC Managed Care – PPO | Admitting: Gastroenterology

## 2012-07-18 VITALS — BP 110/68 | HR 62 | Ht 63.0 in | Wt 144.8 lb

## 2012-07-18 DIAGNOSIS — K625 Hemorrhage of anus and rectum: Secondary | ICD-10-CM

## 2012-07-18 DIAGNOSIS — R109 Unspecified abdominal pain: Secondary | ICD-10-CM

## 2012-07-18 DIAGNOSIS — K5900 Constipation, unspecified: Secondary | ICD-10-CM

## 2012-07-18 MED ORDER — MOVIPREP 100 G PO SOLR: 1.0000 | Freq: Once | ORAL | Status: DC

## 2012-07-18 NOTE — Patient Instructions (Signed)
You have been scheduled for a colonoscopy with propofol. Please follow written instructions given to you at your visit today.  Please pick up your prep kit at the pharmacy within the next 1-3 days. If you use inhalers (even only as needed) or a CPAP machine, please bring them with you on the day of your procedure.  

## 2012-07-18 NOTE — Progress Notes (Signed)
History of Present Illness:  This is a 37 year old African American female with 2 years of worsening constipation requiring multiple laxatives to have a bowel movement.  She denies abdominal pain but does have rather frequent rectal bleeding.  She had a CT scan of her abdomen one year ago which was unremarkable except for large volume of stool in her colon.  She is no associated dysphasia or bladder emptying problems or other neuromuscular columns.  She has tried MiraLax, magnesium citrate, and recently Linzess 145 mcg a day without improvement.  Family history is noncontributory.  Review of labs in her chart shows no other abnormalities.  I have reviewed this patient's present history, medical and surgical past history, allergies and medications.     ROS:   All systems were reviewed and are negative unless otherwise stated in the HPI.    Physical Exam:blood pressure 90/60, pulse 90 and regular, and BMI 25.65 with a weight of 144. General well developed well nourished patient in no acute distress, appearing their stated age Eyes PERRLA, no icterus, fundoscopic exam per opthamologist Skin no lesions noted Neck supple, no adenopathy, no thyroid enlargement, no tenderness Chest clear to percussion and auscultation Heart no significant murmurs, gallops or rubs noted Abdomen no hepatosplenomegaly masses or tenderness, BS normal.  Rectal inspection normal no fissures, or fistulae noted.  No masses or tenderness on digital exam. Stool guaiac negative.rectal squeeze pressure appears normal. Extremities no acute joint lesions, edema, phlebitis or evidence of cellulitis. Neurologic patient oriented x 3, cranial nerves intact, no focal neurologic deficits noted. Psychological mental status normal and normal affect.  Assessment and plan:worsening chronic functional constipation in a 37 year old patient without other serious medical problems.  She appears to have some probable hemorrhoidal bleeding.  I've  schedule her for colonoscopy with standard prep and we'll treat her constipation as indicated.  She may need Amitiza in addition to regular MiraLax, Perdiem with senna, and other laxatives.  After colonoscopy I may consider Sitz marker study to be sure that she does not have normal transit constipation.  Is no evidence of endocrine dysfunction reason to suspect colonic obstruction.  She denies menstrual irregularities or probability of pregnancy.  No diagnosis found.

## 2012-07-19 ENCOUNTER — Encounter: Payer: Self-pay | Admitting: Gastroenterology

## 2012-07-19 ENCOUNTER — Ambulatory Visit (AMBULATORY_SURGERY_CENTER): Payer: BC Managed Care – PPO | Admitting: Gastroenterology

## 2012-07-19 VITALS — BP 110/69 | HR 70 | Temp 97.7°F | Resp 17 | Ht 63.0 in | Wt 144.0 lb

## 2012-07-19 DIAGNOSIS — K625 Hemorrhage of anus and rectum: Secondary | ICD-10-CM

## 2012-07-19 DIAGNOSIS — K648 Other hemorrhoids: Secondary | ICD-10-CM

## 2012-07-19 DIAGNOSIS — K5900 Constipation, unspecified: Secondary | ICD-10-CM

## 2012-07-19 DIAGNOSIS — D1330 Benign neoplasm of unspecified part of small intestine: Secondary | ICD-10-CM

## 2012-07-19 DIAGNOSIS — R109 Unspecified abdominal pain: Secondary | ICD-10-CM

## 2012-07-19 DIAGNOSIS — K5909 Other constipation: Secondary | ICD-10-CM

## 2012-07-19 DIAGNOSIS — K5901 Slow transit constipation: Secondary | ICD-10-CM

## 2012-07-19 MED ORDER — SODIUM CHLORIDE 0.9 % IV SOLN: 500.0000 mL | INTRAVENOUS | Status: DC

## 2012-07-19 NOTE — Op Note (Signed)
[facility] [address] [place], [address]   COLONOSCOPY PROCEDURE REPORT  PATIENT: Dawn Gray, Dawn Gray  MR#: [id number] BIRTHDATE: [birth year] , 36  yrs. old GENDER: Female ENDOSCOPIST: Mardella Layman, MD, [facility] REFERRED BY: PROCEDURE DATE:  07/19/2012 PROCEDURE:   Colonoscopy with biopsy ASA CLASS:   Class I INDICATIONS:Change in bowel habits and Constipation. MEDICATIONS: propofol (Diprivan) 400mg  IV  DESCRIPTION OF PROCEDURE:   After the risks and benefits and of the procedure were explained, informed consent was obtained.  A digital rectal exam revealed no abnormalities of the rectum.    The LB CF-[id number]  endoscope was introduced through the anus and advanced to the terminal ileum which was intubated for a short distance .  The quality of the prep was good, using MoviPrep .  The instrument was then slowly withdrawn as the colon was fully examined.     COLON FINDINGS: The mucosa appeared normal in the terminal ileum. Multiple biopsies were performed.   A normal appearing cecum, ileocecal valve, and appendiceal orifice were identified.  The ascending, hepatic flexure, transverse, splenic flexure, descending, sigmoid colon and rectum appeared unremarkable.  No polyps or cancers were seen.     Retroflexed views revealed internal hemorrhoids.     The scope was then withdrawn from the patient and the procedure completed.  COMPLICATIONS: There were no complications. ENDOSCOPIC IMPRESSION: 1.   Normal mucosa in the terminal ileum; multiple biopsies were performed ...r/o ileitis 2.   Normal colon ..no polyps or obstruction  RECOMMENDATIONS: Await biopsy results trial of Amitiza 24 mcg bid and qhs miralax Ov 1 month..?? Sitz markersl  REPEAT EXAM:  cc:  _______________________________ eSignedMardella Layman, MD, [facility] 07/19/2012 4:00 PM

## 2012-07-19 NOTE — Patient Instructions (Addendum)
YOU HAD AN ENDOSCOPIC PROCEDURE TODAY AT THE [facility]: Refer to the procedure report that was given to you for any specific questions about what was found during the examination.  If the procedure report does not answer your questions, please call your gastroenterologist to clarify.  If you requested that your care partner not be given the details of your procedure findings, then the procedure report has been included in a sealed envelope for you to review at your convenience later.  YOU SHOULD EXPECT: Some feelings of bloating in the abdomen. Passage of more gas than usual.  Walking can help get rid of the air that was put into your GI tract during the procedure and reduce the bloating. If you had a lower endoscopy (such as a colonoscopy or flexible sigmoidoscopy) you may notice spotting of blood in your stool or on the toilet paper. If you underwent a bowel prep for your procedure, then you may not have a normal bowel movement for a few days.  DIET: Your first meal following the procedure should be a light meal and then it is ok to progress to your normal diet.  A half-sandwich or bowl of soup is an example of a good first meal.  Heavy or fried foods are harder to digest and may make you feel nauseous or bloated.  Likewise meals heavy in dairy and vegetables can cause extra gas to form and this can also increase the bloating.  Drink plenty of fluids but you should avoid alcoholic beverages for 24 hours.  ACTIVITY: Your care partner should take you home directly after the procedure.  You should plan to take it easy, moving slowly for the rest of the day.  You can resume normal activity the day after the procedure however you should NOT DRIVE or use heavy machinery for 24 hours (because of the sedation medicines used during the test).    SYMPTOMS TO REPORT IMMEDIATELY: A gastroenterologist can be reached at any hour.  During normal business hours, 8:30 AM to 5:00 PM Monday through Friday,  call [phone].  After hours and on weekends, please call the GI answering service at [phone] who will take a message and have the physician on call contact you.   Following lower endoscopy (colonoscopy or flexible sigmoidoscopy):  Excessive amounts of blood in the stool  Significant tenderness or worsening of abdominal pains  Swelling of the abdomen that is new, acute  Fever of 100F or higher    FOLLOW UP: If any biopsies were taken you will be contacted by phone or by letter within the next 1-3 weeks.  Call your gastroenterologist if you have not heard about the biopsies in 3 weeks.  Our staff will call the home number listed on your records the next business day following your procedure to check on you and address any questions or concerns that you may have at that time regarding the information given to you following your procedure. This is a courtesy call and so if there is no answer at the home number and we have not heard from you through the emergency physician on call, we will assume that you have returned to your regular daily activities without incident.  SIGNATURES/CONFIDENTIALITY: You and/or your care partner have signed paperwork which will be entered into your electronic medical record.  These signatures attest to the fact that that the information above on your After Visit Summary has been reviewed and is understood.  Full responsibility of the confidentiality   of this discharge information lies with you and/or your care-partner.      Await biopsy results  Amitiza twice daily   Miralax (over the counter) every night  Office visit with Dr Jarold Motto March 10th 2014 , 3:45 pm

## 2012-07-19 NOTE — Progress Notes (Signed)
Patient did not have preoperative order for IV antibiotic SSI prophylaxis. (G8918) Patient did not have preoperative order for IV antibiotic SSI prophylaxis. (G8918)  

## 2012-07-19 NOTE — Progress Notes (Signed)
Patient did not have preoperative order for IV antibiotic SSI prophylaxis. (G8918)  Patient did not experience any of the following events: a burn prior to discharge; a fall within the facility; wrong site/side/patient/procedure/implant event; or a hospital transfer or hospital admission upon discharge from the facility. (G8907)  

## 2012-07-20 ENCOUNTER — Telehealth: Payer: Self-pay

## 2012-07-20 MED ORDER — VALACYCLOVIR HCL 1 G PO TABS: 1000.0000 mg | ORAL_TABLET | Freq: Every day | ORAL | Status: DC

## 2012-07-20 NOTE — Telephone Encounter (Signed)
  Follow up Call-  Call back number 07/19/2012  Post procedure Call Back phone  # [phone]  Permission to leave phone message Yes     Patient questions:  Do you have a fever, pain , or abdominal swelling? no Pain Score  0 *  Have you tolerated food without any problems? yes  Have you been able to return to your normal activities? yes  Do you have any questions about your discharge instructions: Diet   no Medications  no Follow up visit  no  Do you have questions or concerns about your Care? no  Actions: * If pain score is 4 or above: No action needed, pain <4.

## 2012-07-21 ENCOUNTER — Telehealth: Payer: Self-pay

## 2012-07-21 DIAGNOSIS — D3610 Benign neoplasm of peripheral nerves and autonomic nervous system, unspecified: Secondary | ICD-10-CM

## 2012-07-21 NOTE — Telephone Encounter (Signed)
Records received from the patient previous provider and she will need a follow-up chest X-ray. I will advise the patient to bring her films from her previous provider, Msg left to call the office      KP

## 2012-07-24 ENCOUNTER — Encounter: Payer: Self-pay | Admitting: Gastroenterology

## 2012-07-24 NOTE — Telephone Encounter (Signed)
Patient aware and she voiced understanding.      KP

## 2012-08-21 ENCOUNTER — Ambulatory Visit: Payer: BC Managed Care – PPO | Admitting: Gastroenterology

## 2012-09-07 ENCOUNTER — Telehealth: Payer: Self-pay | Admitting: Gastroenterology

## 2012-09-07 NOTE — Telephone Encounter (Signed)
Message copied by Arna Snipe on Thu Sep 07, 2012  2:34 PM ------      Message from: Ok Anis A      Created: Mon Aug 21, 2012  4:45 PM       Charge please ------

## 2012-10-02 ENCOUNTER — Telehealth: Payer: Self-pay

## 2012-10-02 NOTE — Telephone Encounter (Signed)
Call from patient and she stated her employer is forcing her to take her lunch break at 10:30 am after only being at work for 3 hours, she wanted to know if she can get a work note stating she can take a lunch break at 11:30 instead of 10:30 because she gets off at 4:30. Please advise     KP

## 2012-10-03 NOTE — Telephone Encounter (Signed)
Please advise      KP 

## 2012-10-03 NOTE — Telephone Encounter (Signed)
Patient has been made aware.      KP 

## 2012-10-03 NOTE — Telephone Encounter (Signed)
There is no medical reason for that ----so I really cant---although I do agree it doesn't make sense

## 2012-11-02 ENCOUNTER — Ambulatory Visit (INDEPENDENT_AMBULATORY_CARE_PROVIDER_SITE_OTHER)
Admission: RE | Admit: 2012-11-02 | Discharge: 2012-11-02 | Disposition: A | Discharge status: Home / Self Care | Payer: BC Managed Care – PPO | Source: Ambulatory Visit | Attending: Family Medicine | Admitting: Family Medicine

## 2012-11-02 DIAGNOSIS — D219 Benign neoplasm of connective and other soft tissue, unspecified: Secondary | ICD-10-CM

## 2012-11-02 DIAGNOSIS — D3610 Benign neoplasm of peripheral nerves and autonomic nervous system, unspecified: Secondary | ICD-10-CM

## 2012-11-09 ENCOUNTER — Ambulatory Visit (INDEPENDENT_AMBULATORY_CARE_PROVIDER_SITE_OTHER): Payer: BC Managed Care – PPO | Admitting: Family Medicine

## 2012-11-09 ENCOUNTER — Encounter: Payer: Self-pay | Admitting: Family Medicine

## 2012-11-09 VITALS — BP 110/70 | HR 99 | Temp 99.1°F | Wt 144.4 lb

## 2012-11-09 DIAGNOSIS — J019 Acute sinusitis, unspecified: Secondary | ICD-10-CM

## 2012-11-09 MED ORDER — CEFUROXIME AXETIL 500 MG PO TABS: 500.0000 mg | ORAL_TABLET | Freq: Two times a day (BID) | ORAL | Status: AC

## 2012-11-09 MED ORDER — GUAIFENESIN-CODEINE 100-10 MG/5ML PO SYRP: ORAL_SOLUTION | ORAL | Status: DC

## 2012-11-09 MED ORDER — MOMETASONE FUROATE 50 MCG/ACT NA SUSP: 2.0000 | Freq: Every day | NASAL | Status: DC

## 2012-11-09 NOTE — Progress Notes (Signed)
  Subjective:     Dawn Gray is a 37 y.o. female who presents for evaluation of sinus pain. Symptoms include: congestion, cough, facial pain, headaches, nasal congestion and sinus pressure. Onset of symptoms was 1 week ago. Symptoms have been gradually worsening since that time. Past history is significant for occasional episodes of bronchitis. Patient is a non-smoker.  The following portions of the patient's history were reviewed and updated as appropriate: allergies, current medications, past family history, past medical history, past social history, past surgical history and problem list.  Review of Systems Pertinent items are noted in HPI.   Objective:    BP 110/70  Pulse 99  Temp(Src) 99.1 F (37.3 C) (Oral)  Wt 144 lb 6.4 oz (65.499 kg)  BMI 25.59 kg/m2  SpO2 98%  LMP 10/31/2012 General appearance: alert, cooperative, appears stated age and no distress Ears: normal TM's and external ear canals both ears Nose: green discharge, mild congestion, turbinates red, swollen, sinus tenderness bilateral Throat: abnormal findings: mild oropharyngeal erythema Neck: mild anterior cervical adenopathy, supple, symmetrical, trachea midline and thyroid not enlarged, symmetric, no tenderness/mass/nodules Lungs: clear to auscultation bilaterally Heart: S1, S2 normal    Assessment:    Acute bacterial sinusitis.    Plan:    Neti pot recommended. Instructions given. Nasal steroids per medication orders. Antihistamines per medication orders. Ceftin per medication orders.  Subjective:

## 2012-11-09 NOTE — Patient Instructions (Signed)

## 2012-11-21 ENCOUNTER — Ambulatory Visit: Payer: BC Managed Care – PPO | Admitting: Gastroenterology

## 2012-11-23 ENCOUNTER — Ambulatory Visit (INDEPENDENT_AMBULATORY_CARE_PROVIDER_SITE_OTHER): Payer: BC Managed Care – PPO | Admitting: Gastroenterology

## 2012-11-23 ENCOUNTER — Encounter: Payer: Self-pay | Admitting: Gastroenterology

## 2012-11-23 VITALS — BP 100/62 | HR 80 | Ht 64.0 in | Wt 144.0 lb

## 2012-11-23 DIAGNOSIS — K5900 Constipation, unspecified: Secondary | ICD-10-CM

## 2012-11-23 DIAGNOSIS — K589 Irritable bowel syndrome without diarrhea: Secondary | ICD-10-CM

## 2012-11-23 MED ORDER — LUBIPROSTONE 24 MCG PO CAPS: 24.0000 ug | ORAL_CAPSULE | Freq: Two times a day (BID) | ORAL | Status: DC

## 2012-11-23 NOTE — Patient Instructions (Addendum)
  We have sent the following medications to your pharmacy for you to pick up at your convenience: Amitiza 24 mcg, please take one capsule by mouth twice daily with food.  Please follow up with Dr. Jarold Motto in one year. ___________________________________________________________                                               We are excited to introduce MyChart, a new best-in-class service that provides you online access to important information in your electronic medical record. We want to make it easier for you to view your health information - all in one secure location - when and where you need it. We expect MyChart will enhance the quality of care and service we provide.  When you register for MyChart, you can:    View your test results.    Request appointments and receive appointment reminders via email.    Request medication renewals.    View your medical history, allergies, medications and immunizations.    Communicate with your physician's office through a password-protected site.    Conveniently print information such as your medication lists.  To find out if MyChart is right for you, please talk to a member of our clinical staff today. We will gladly answer your questions about this free health and wellness tool.  If you are age 69 or older and want a member of your family to have access to your record, you must provide written consent by completing a proxy form available at our office. Please speak to our clinical staff about guidelines regarding accounts for patients younger than age 53.  As you activate your MyChart account and need any technical assistance, please call the MyChart technical support line at [phone]) or email your question to [email or web address]. If you email your question(s), please include your name, a return phone number and the best time to reach you.  If you have non-urgent health-related questions, you can send a message to  our office through MyChart at [email or web address]. If you have a medical emergency, call 911.  Thank you for using MyChart as your new health and wellness resource!   MyChart licensed from [employer],  [id number]. Patents Pending.

## 2012-11-23 NOTE — Progress Notes (Signed)
This is a very pleasant 37 year old [place] American female with chronic functional constipation and negative colonoscopy in February.  She currently is asymptomatic on a high fiber diet, daily Benefiber, liberal by mouth fluids, and Amitiza 24 mcg one to 2 times a day.  She denies abdominal pain, constipation, rectal bleeding, upper GI, hepatobiliary, or systemic complaints.  Current Medications, Allergies, Past Medical History, Past Surgical History, Family History and Social History were reviewed in [employer] record.  ROS: All systems were reviewed and are negative unless otherwise stated in the HPI.          Physical Exam: At pressure 100/62, pulse 80 and regular, and weight 144 the BMI of 24.71.  I cannot appreciate stigmata of chronic liver disease.  Abdominal exam shows no organomegaly, masses or tenderness.  Bowel sounds are normal.  Mental status is normal.    Assessment and Plan: Slow transit constipation improved on above regime.  We have renewed her Amitiza 24 mcg one to 2 times a day as needed.  We'll see her on when necessary basis the future as needed. No diagnosis found.

## 2012-11-28 IMAGING — CT CT ABD-PELV W/ CM
1 of 2 series · 15 of 32 positions shown, 19 images · IV contrast (omnipaque)
Comparison: None.

CLINICAL DATA: Abdominal pain.  GI bleeding.  Rectal bleeding.

CT ABDOMEN AND PELVIS WITH CONTRAST
TECHNIQUE: Multidetector CT imaging of the abdomen and pelvis was
performed following the standard protocol during bolus
administration of intravenous contrast.
Contrast: 100mL OMNIPAQUE IOHEXOL 300 MG/ML IV SOLN

[Series 2: abd/pel with · axial · 0.62mm/px · z∈[-436,-76]mm · 15 of 79 slices shown, 19 images]
[im 4/79  soft-tissue]
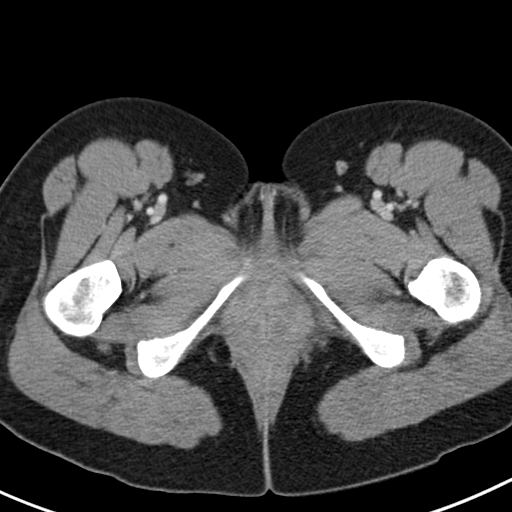
[im 4/79  bone]
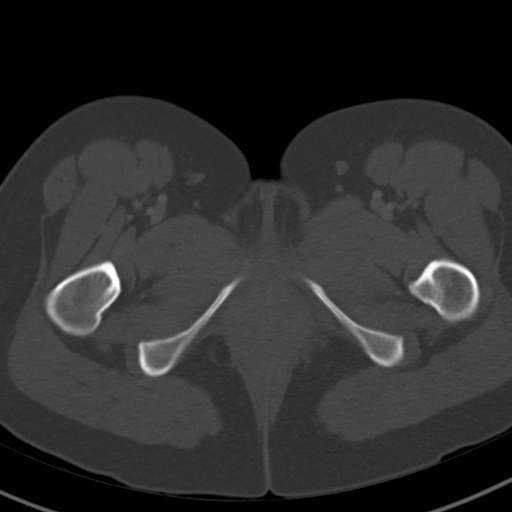
[im 10/79  soft-tissue]
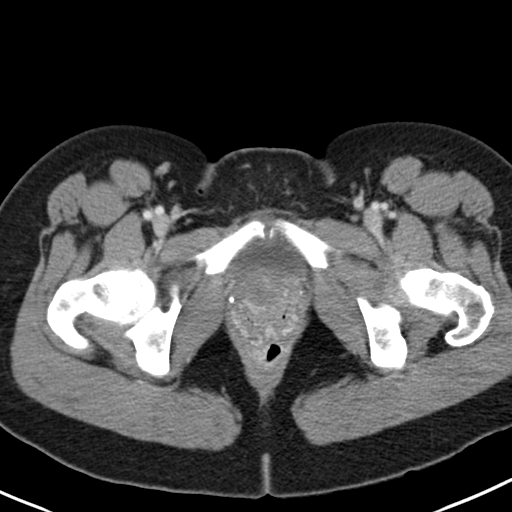
[im 16/79  soft-tissue]
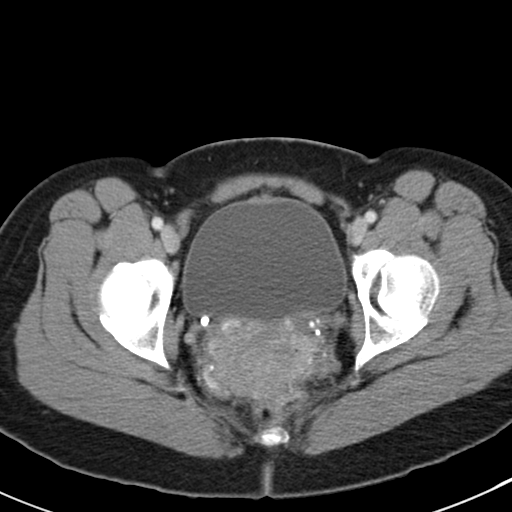
[im 22/79  soft-tissue]
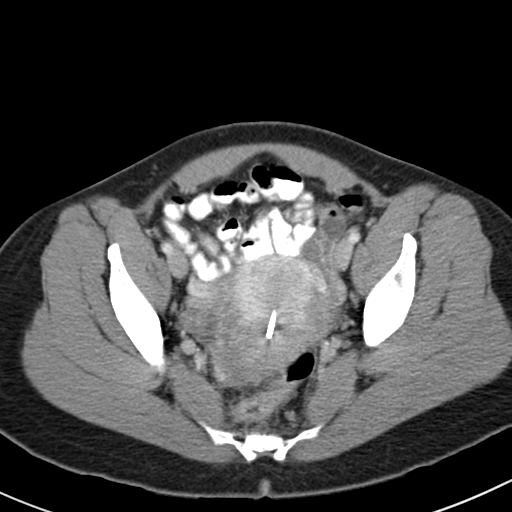
[im 28/79  soft-tissue]
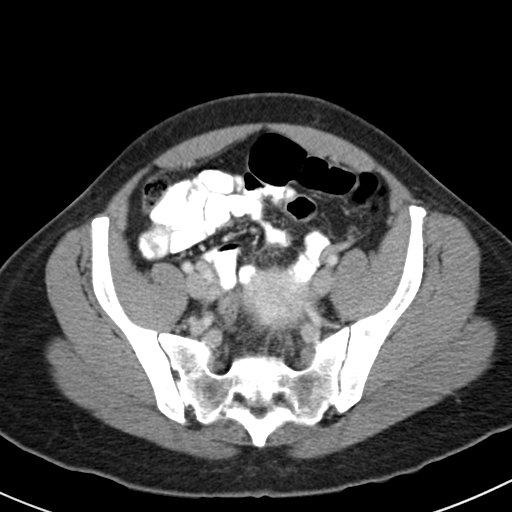
[im 34/79  soft-tissue]
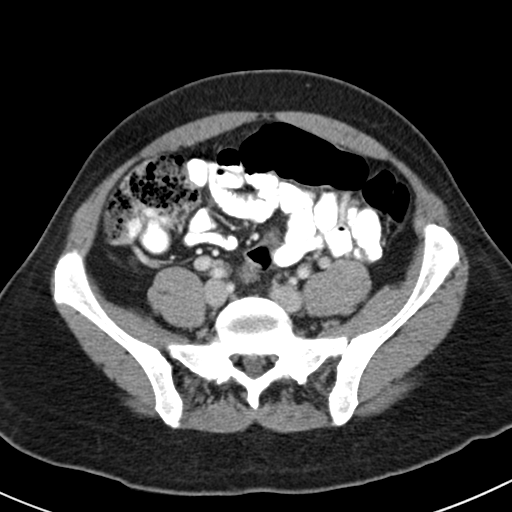
[im 40/79  soft-tissue]
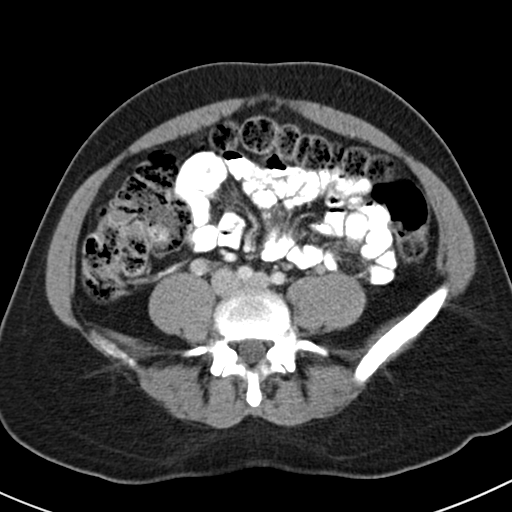
[im 46/79  soft-tissue]
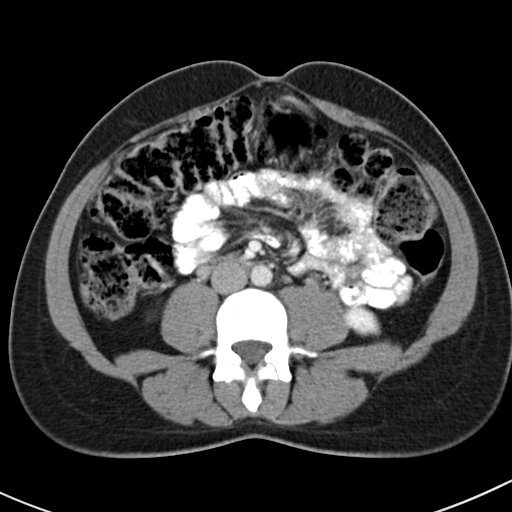
[im 52/79  soft-tissue]
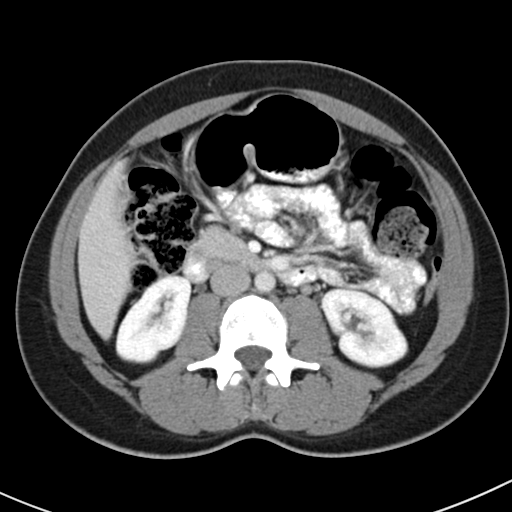
[im 52/79  bone]
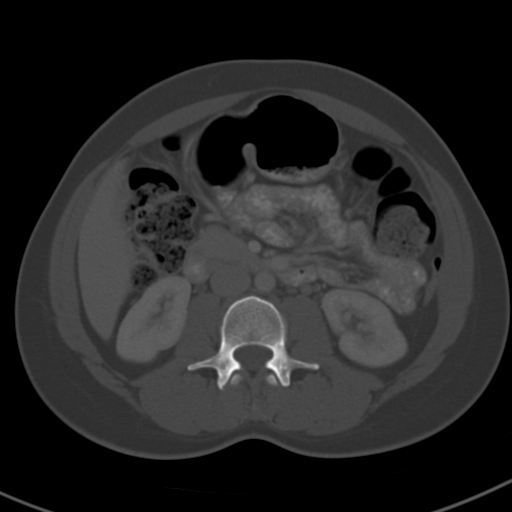
[im 58/79  soft-tissue]
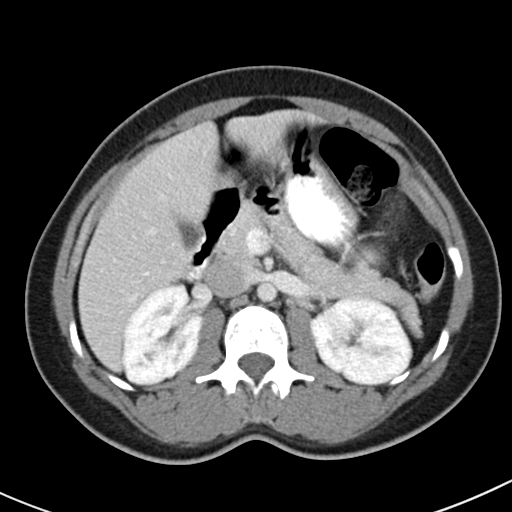
[im 64/79  soft-tissue]
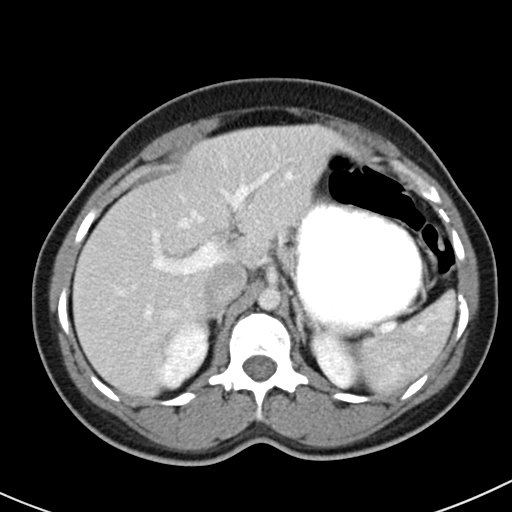
[im 67/79  lung]
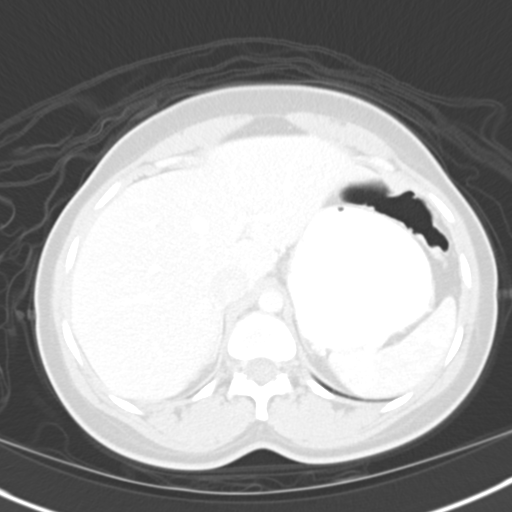
[im 70/79  soft-tissue]
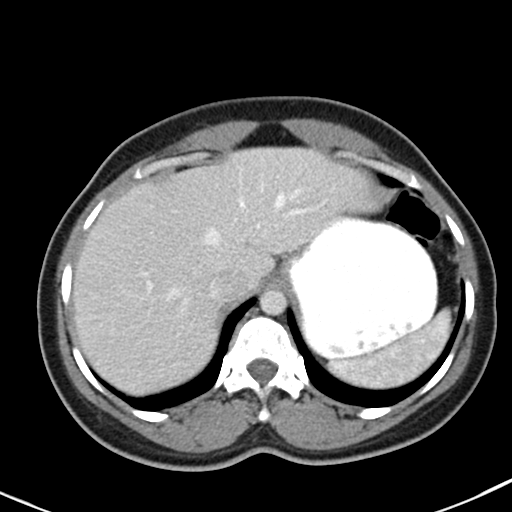
[im 70/79  lung]
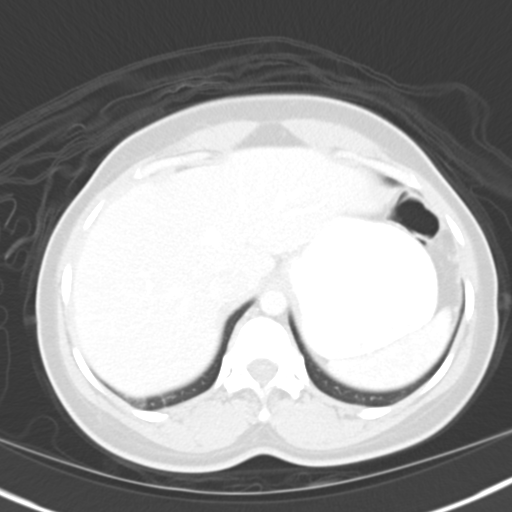
[im 73/79  lung]
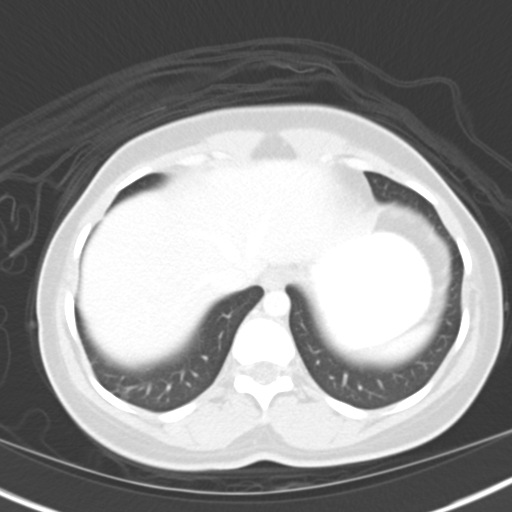
[im 76/79  soft-tissue]
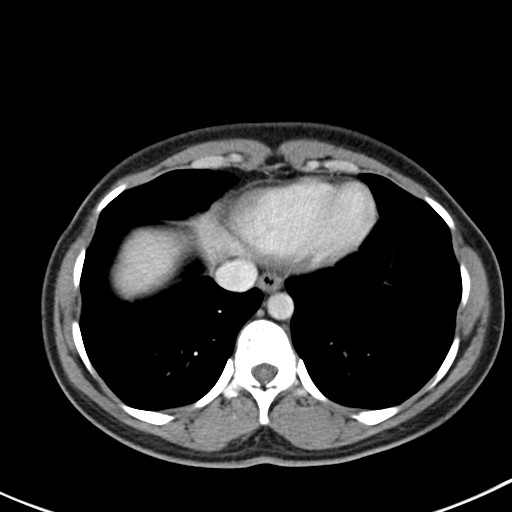
[im 76/79  lung]
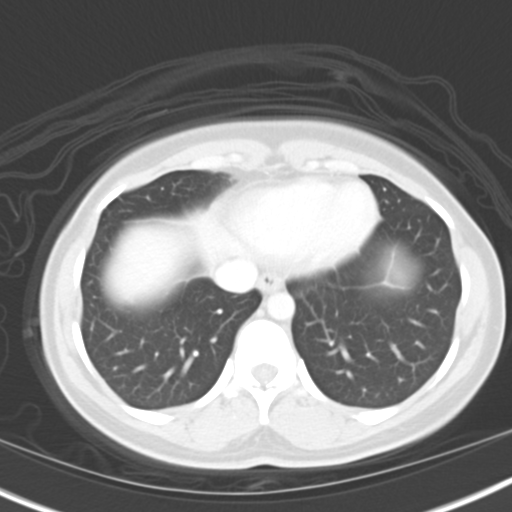

[15 of 32 positions shown; findings below may reference images not displayed]

FINDINGS: Lung bases clear.  Liver appears within normal limits.
Gallbladder is mostly decompressed.  No inflammatory changes of the
gallbladder.  Spleen normal.  Pancreas normal.  Common bile duct
normal.  Abdominal vasculature appears within normal limits.
Normal renal enhancement.  There is ectasia of the right upper pole
renal collecting system and a prominent column of Bertin.  The
appearance is suggestive of at least partial duplication of the
right renal collecting system.  Delayed renal images were not
performed per protocol.  No renal calculi.  Normal renal
enhancement.

Small bowel appears within normal limits.  Normal appendix in the
right lower quadrant.  Large stool burden is present in the
ascending, transverse and proximal descending colon.  Sigmoid colon
appears mostly decompressed.  Prom prominent veins are present in
the anatomic pelvis around the uterus which could be associated
with pelvic congestion syndrome.  IUD is present within the uterus.
Urinary bladder appears normal.  Scattered calcified phleboliths.
No adenopathy is identified.  Bones appear within normal limits.
IMPRESSION: 1.  Large right-sided stool burden.  Normal appendix.
2.  IUD present within the uterus.
3.  At least partial duplication of the right renal collecting
system with ectasia of the right infrarenal upper pole collecting
system.  No definite obstruction.

## 2013-01-22 ENCOUNTER — Telehealth: Payer: Self-pay

## 2013-01-22 ENCOUNTER — Other Ambulatory Visit: Payer: BC Managed Care – PPO

## 2013-01-22 DIAGNOSIS — Z202 Contact with and (suspected) exposure to infections with a predominantly sexual mode of transmission: Secondary | ICD-10-CM

## 2013-01-22 NOTE — Telephone Encounter (Signed)
Call from patient who wanted to come in and be retested for HSV2. Please advise     KP

## 2013-01-22 NOTE — Telephone Encounter (Signed)
Ok to retest as long as it has been more than 3 weeks since last one

## 2013-01-22 NOTE — Telephone Encounter (Signed)
Orders in and apt scheduled       KP

## 2013-01-23 LAB — HSV 2 ANTIBODY, IGG: HSV 2 Glycoprotein G Ab, IgG: 8.64 IV — ABNORMAL HIGH

## 2013-01-29 ENCOUNTER — Other Ambulatory Visit: Payer: BC Managed Care – PPO

## 2013-04-30 ENCOUNTER — Telehealth: Payer: Self-pay

## 2013-04-30 ENCOUNTER — Encounter: Payer: Self-pay | Admitting: Family Medicine

## 2013-04-30 DIAGNOSIS — D3610 Benign neoplasm of peripheral nerves and autonomic nervous system, unspecified: Secondary | ICD-10-CM | POA: Insufficient documentation

## 2013-04-30 NOTE — Telephone Encounter (Signed)
Hx schwannoma and bronchitis--- ok to write note

## 2013-04-30 NOTE — Telephone Encounter (Signed)
Letter complete and the patient has been made aware.     KP

## 2013-04-30 NOTE — Telephone Encounter (Signed)
Call from patient and she has mold in her home and she is starting to develop a cough, She is trying to get her apartment complex to move her but they sprayed and painted over the mold and told her it was ok. She is still having the dry cough and with her History of Bronchitis and Lung tumors, she would like a letter stating she needs to be moved into another apartment.        KP

## 2013-05-16 ENCOUNTER — Telehealth: Payer: Self-pay | Admitting: Family Medicine

## 2013-05-16 NOTE — Telephone Encounter (Signed)
Did you mean to send this to me?

## 2013-05-16 NOTE — Telephone Encounter (Signed)
Patient called and requested to speak to Dawn Gray.

## 2013-05-16 NOTE — Telephone Encounter (Signed)
Spoke with patient and she was having some sinus issues. Offered her an apt with another provider due to Dr.Lowne being out of the office and she said she wanted to give a few more days before she came in. No apt scheudled      KP

## 2013-11-22 ENCOUNTER — Encounter: Payer: Self-pay | Admitting: Family Medicine

## 2013-11-22 ENCOUNTER — Ambulatory Visit (INDEPENDENT_AMBULATORY_CARE_PROVIDER_SITE_OTHER): Payer: BC Managed Care – PPO | Admitting: Family Medicine

## 2013-11-22 VITALS — BP 114/74 | HR 80 | Temp 98.5°F | Wt 147.4 lb

## 2013-11-22 DIAGNOSIS — J45909 Unspecified asthma, uncomplicated: Secondary | ICD-10-CM

## 2013-11-22 DIAGNOSIS — Z9109 Other allergy status, other than to drugs and biological substances: Secondary | ICD-10-CM

## 2013-11-22 MED ORDER — LEVOCETIRIZINE DIHYDROCHLORIDE 5 MG PO TABS: 5.0000 mg | ORAL_TABLET | Freq: Every evening | ORAL | Status: DC

## 2013-11-22 MED ORDER — TRIAMCINOLONE ACETONIDE 55 MCG/ACT NA AERO: 2.0000 | INHALATION_SPRAY | Freq: Every day | NASAL | Status: DC

## 2013-11-22 MED ORDER — ALBUTEROL SULFATE HFA 108 (90 BASE) MCG/ACT IN AERS: 2.0000 | INHALATION_SPRAY | Freq: Four times a day (QID) | RESPIRATORY_TRACT | Status: DC | PRN

## 2013-11-22 NOTE — Progress Notes (Signed)
  Subjective:     Dawn Gray is a 38 y.o. female who presents for evaluation of symptoms of a URI. Symptoms include congestion, facial pain, nasal congestion, non productive cough, post nasal drip, sneezing and sore throat. Onset of symptoms was 4 days ago, and has been gradually improving since that time. Treatment to date: none.  The following portions of the patient's history were reviewed and updated as appropriate: allergies, current medications, past family history, past medical history, past social history, past surgical history and problem list.  Review of Systems Pertinent items are noted in HPI.   Objective:    BP 114/74  Pulse 80  Temp(Src) 98.5 F (36.9 C) (Oral)  Wt 147 lb 6.4 oz (66.86 kg)  SpO2 98% General appearance: alert, cooperative, appears stated age and no distress Ears: normal TM's and external ear canals both ears Nose: Nares normal. Septum midline. Mucosa normal. No drainage or sinus tenderness., clear discharge, moderate congestion, turbinates pink, swollen, no sinus tenderness Throat: lips, mucosa, and tongue normal; teeth and gums normal Neck: no adenopathy, supple, symmetrical, trachea midline and thyroid not enlarged, symmetric, no tenderness/mass/nodules Lungs: clear to auscultation bilaterally Heart: S1, S2 normal   Assessment:    allergic rhinitis   Plan:    Discussed diagnosis and treatment of URI. Suggested symptomatic OTC remedies. Nasal saline spray for congestion. Nasal steroids per orders. Follow up as needed.

## 2013-11-22 NOTE — Progress Notes (Signed)
Pre visit review using our clinic review tool, if applicable. No additional management support is needed unless otherwise documented below in the visit note. 

## 2013-11-22 NOTE — Patient Instructions (Signed)

## 2014-05-14 ENCOUNTER — Encounter: Payer: Self-pay | Admitting: Family Medicine

## 2014-05-14 ENCOUNTER — Ambulatory Visit (INDEPENDENT_AMBULATORY_CARE_PROVIDER_SITE_OTHER): Payer: BC Managed Care – PPO | Admitting: Family Medicine

## 2014-05-14 ENCOUNTER — Other Ambulatory Visit (HOSPITAL_COMMUNITY)
Admission: RE | Admit: 2014-05-14 | Discharge: 2014-05-14 | Disposition: A | Discharge status: Home / Self Care | Payer: BC Managed Care – PPO | Source: Ambulatory Visit | Attending: Family Medicine | Admitting: Family Medicine

## 2014-05-14 ENCOUNTER — Ambulatory Visit (HOSPITAL_BASED_OUTPATIENT_CLINIC_OR_DEPARTMENT_OTHER)
Admission: RE | Admit: 2014-05-14 | Discharge: 2014-05-14 | Disposition: A | Discharge status: Home / Self Care | Payer: BC Managed Care – PPO | Source: Ambulatory Visit | Attending: Family Medicine | Admitting: Family Medicine

## 2014-05-14 VITALS — BP 120/83 | HR 86 | Temp 98.3°F | Ht 64.0 in | Wt 144.0 lb

## 2014-05-14 DIAGNOSIS — R079 Chest pain, unspecified: Secondary | ICD-10-CM | POA: Insufficient documentation

## 2014-05-14 DIAGNOSIS — Z01419 Encounter for gynecological examination (general) (routine) without abnormal findings: Secondary | ICD-10-CM | POA: Insufficient documentation

## 2014-05-14 DIAGNOSIS — Z1151 Encounter for screening for human papillomavirus (HPV): Secondary | ICD-10-CM | POA: Diagnosis present

## 2014-05-14 DIAGNOSIS — Z0000 Encounter for general adult medical examination without abnormal findings: Secondary | ICD-10-CM

## 2014-05-14 DIAGNOSIS — Z124 Encounter for screening for malignant neoplasm of cervix: Secondary | ICD-10-CM

## 2014-05-14 DIAGNOSIS — D3610 Benign neoplasm of peripheral nerves and autonomic nervous system, unspecified: Secondary | ICD-10-CM

## 2014-05-14 DIAGNOSIS — R0602 Shortness of breath: Secondary | ICD-10-CM

## 2014-05-14 DIAGNOSIS — R8290 Unspecified abnormal findings in urine: Secondary | ICD-10-CM

## 2014-05-14 DIAGNOSIS — R319 Hematuria, unspecified: Secondary | ICD-10-CM

## 2014-05-14 LAB — POCT URINALYSIS DIPSTICK
Bilirubin, UA: NEGATIVE
GLUCOSE UA: NEGATIVE
KETONES UA: NEGATIVE
Nitrite, UA: NEGATIVE
Urobilinogen, UA: 0.2
pH, UA: 5

## 2014-05-14 NOTE — Progress Notes (Signed)
Subjective:     Dawn Gray is a 38 y.o. female and is here for a comprehensive physical exam. The patient reports no problems.  History   Social History  . Marital Status: Single    Spouse Name: N/A    Number of Children: N/A  . Years of Education: N/A   Occupational History  . Not on file.   Social History Main Topics  . Smoking status: Never Smoker   . Smokeless tobacco: Never Used  . Alcohol Use: Yes     Comment: occasional  . Drug Use: No  . Sexual Activity:    Partners: Male    Birth Control/ Protection: IUD   Other Topics Concern  . Not on file   Social History Narrative   Health Maintenance  Topic Date Due  . INFLUENZA VACCINE  05/15/2015 (Originally 01/12/2014)  . PAP SMEAR  07/14/2015  . 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 She  does not have any pertinent problems on file. She  has past surgical history that includes Tumor removal (01/2009). Her family history includes Arthritis in her mother and paternal grandmother; Cancer in her maternal grandfather; Diabetes in her father, mother, and paternal grandmother; Berenice Primas' disease in her father; Heart disease in her maternal uncle; Hyperlipidemia in her father, maternal grandfather, paternal grandfather, and paternal grandmother; Hypertension in her father and mother; Lung cancer in her paternal grandmother; Stroke in her maternal grandmother and paternal grandmother. There is no history of Colon cancer. She  reports that she has never smoked. She has never used smokeless tobacco. She reports that she drinks alcohol. She reports that she does not use illicit drugs. She has a current medication list which includes the following prescription(s): albuterol, lubiprostone,  and valacyclovir. Current Outpatient Prescriptions on File Prior to Visit  Medication Sig Dispense Refill  . albuterol (PROAIR HFA) 108 (90 BASE) MCG/ACT inhaler Inhale 2 puffs into the lungs every 6 (six) hours as needed for wheezing or shortness of breath. 1 Inhaler 5  . lubiprostone (AMITIZA) 24 MCG capsule Take 1 capsule (24 mcg total) by mouth 2 (two) times daily with a meal. 60 capsule 3  . valACYclovir (VALTREX) 1000 MG tablet Take 1 tablet (1,000 mg total) by mouth daily. 30 tablet 11   No current facility-administered medications on file prior to visit.   She has No Known Allergies..  Review of Systems Review of Systems  Constitutional: Negative for activity change, appetite change and fatigue.  HENT: Negative for hearing loss, congestion, tinnitus and ear discharge.  dentist q34m Eyes: Negative for visual disturbance (see optho q1y -- vision corrected to 20/20 with glasses).  Respiratory: Negative for cough, chest tightness and shortness of breath.   Cardiovascular: Negative for chest pain, palpitations and leg swelling.  Gastrointestinal: Negative for abdominal pain, diarrhea, constipation and abdominal distention.  Genitourinary: Negative for urgency, frequency, decreased urine volume and difficulty urinating.  Musculoskeletal: Negative for back pain, arthralgias and gait problem.  Skin: Negative for color change, pallor and rash.  Neurological: Negative for dizziness, light-headedness, numbness and headaches.  Hematological: Negative for adenopathy. Does not bruise/bleed easily.  Psychiatric/Behavioral: Negative for suicidal ideas, confusion, sleep disturbance, self-injury, dysphoric mood, decreased concentration and agitation.       Objective:    BP 120/83 mmHg  Pulse 86  Temp(Src) 98.3 F (36.8 C) (Oral)  Ht 5\' 4"  (1.626 m)  Wt 144 lb (65.318 kg)  BMI 24.71 kg/m2  SpO2 100%  LMP 04/28/2014 (LMP Unknown) General appearance: alert, cooperative, appears stated age  and no distress Head: Normocephalic, without obvious abnormality, atraumatic Eyes: conjunctivae/corneas clear. PERRL, EOM's intact. Fundi benign. Ears: normal TM's and external ear canals both ears Nose: Nares normal. Septum midline. Mucosa normal. No drainage or sinus tenderness. Throat: lips, mucosa, and tongue normal; teeth and gums normal Neck: no adenopathy, no carotid bruit, no JVD, supple, symmetrical, trachea midline and thyroid not enlarged, symmetric, no tenderness/mass/nodules Back: symmetric, no curvature. ROM normal. No CVA tenderness. Lungs: clear to auscultation bilaterally Breasts: normal appearance, no masses or tenderness Heart: S1, S2 normal Abdomen: soft, non-tender; bowel sounds normal; no masses,  no organomegaly Pelvic: cervix normal in appearance, external genitalia normal, no adnexal masses or tenderness, no cervical motion tenderness, rectovaginal septum normal, uterus normal size, shape, and consistency, vagina normal without discharge and pap done=--- mirena string observed Extremities: extremities normal, atraumatic, no cyanosis or edema Pulses: 2+ and symmetric Skin: Skin color, texture, turgor normal. No rashes or lesions Lymph nodes: Cervical, supraclavicular, and axillary nodes normal. Neurologic: Alert and oriented X 3, normal strength and tone. Normal symmetric reflexes. Normal coordination and gait Psych-- no anxiety, no depression      Assessment:    Healthy female exam.      Plan:  Check labs   ghm utd  See After Visit Summary for Counseling Recommendations    1. Screening for malignant neoplasm of cervix  - Cytology - PAP  2. Preventative health care  - Basic metabolic panel - CBC with Differential - Hepatic function panel - Lipid panel - POCT urinalysis dipstick - TSH - Cytology - PAP  3. Schwannoma  - DG Chest 2 View; Future  4. SOB (shortness of breath)  - DG Chest 2 View; Future  5. Hematuria  - Urine Culture  6. Urine  abnormality  - Urine Culture

## 2014-05-14 NOTE — Progress Notes (Signed)
Pre visit review using our clinic review tool, if applicable. No additional management support is needed unless otherwise documented below in the visit note. 

## 2014-05-14 NOTE — Patient Instructions (Signed)
Preventive Care for Adults A healthy lifestyle and preventive care can promote health and wellness. Preventive health guidelines for women include the following key practices.  A routine yearly physical is a good way to check with your health care provider about your health and preventive screening. It is a chance to share any concerns and updates on your health and to receive a thorough exam.  Visit your dentist for a routine exam and preventive care every 6 months. Brush your teeth twice a day and floss once a day. Good oral hygiene prevents tooth decay and gum disease.  The frequency of eye exams is based on your age, health, family medical history, use of contact lenses, and other factors. Follow your health care provider's recommendations for frequency of eye exams.  Eat a healthy diet. Foods like vegetables, fruits, whole grains, low-fat dairy products, and lean protein foods contain the nutrients you need without too many calories. Decrease your intake of foods high in solid fats, added sugars, and salt. Eat the right amount of calories for you.Get information about a proper diet from your health care provider, if necessary.  Regular physical exercise is one of the most important things you can do for your health. Most adults should get at least 150 minutes of moderate-intensity exercise (any activity that increases your heart rate and causes you to sweat) each week. In addition, most adults need muscle-strengthening exercises on 2 or more days a week.  Maintain a healthy weight. The body mass index (BMI) is a screening tool to identify possible weight problems. It provides an estimate of body fat based on height and weight. Your health care provider can find your BMI and can help you achieve or maintain a healthy weight.For adults 20 years and older:  A BMI below 18.5 is considered underweight.  A BMI of 18.5 to 24.9 is normal.  A BMI of 25 to 29.9 is considered overweight.  A BMI of  30 and above is considered obese.  Maintain normal blood lipids and cholesterol levels by exercising and minimizing your intake of saturated fat. Eat a balanced diet with plenty of fruit and vegetables. Blood tests for lipids and cholesterol should begin at age 76 and be repeated every 5 years. If your lipid or cholesterol levels are high, you are over 50, or you are at high risk for heart disease, you may need your cholesterol levels checked more frequently.Ongoing high lipid and cholesterol levels should be treated with medicines if diet and exercise are not working.  If you smoke, find out from your health care provider how to quit. If you do not use tobacco, do not start.  Lung cancer screening is recommended for adults aged 22-80 years who are at high risk for developing lung cancer because of a history of smoking. A yearly low-dose CT scan of the lungs is recommended for people who have at least a 30-pack-year history of smoking and are a current smoker or have quit within the past 15 years. A pack year of smoking is smoking an average of 1 pack of cigarettes a day for 1 year (for example: 1 pack a day for 30 years or 2 packs a day for 15 years). Yearly screening should continue until the smoker has stopped smoking for at least 15 years. Yearly screening should be stopped for people who develop a health problem that would prevent them from having lung cancer treatment.  If you are pregnant, do not drink alcohol. If you are breastfeeding,  be very cautious about drinking alcohol. If you are not pregnant and choose to drink alcohol, do not have more than 1 drink per day. One drink is considered to be 12 ounces (355 mL) of beer, 5 ounces (148 mL) of wine, or 1.5 ounces (44 mL) of liquor.  Avoid use of street drugs. Do not share needles with anyone. Ask for help if you need support or instructions about stopping the use of drugs.  High blood pressure causes heart disease and increases the risk of  stroke. Your blood pressure should be checked at least every 1 to 2 years. Ongoing high blood pressure should be treated with medicines if weight loss and exercise do not work.  If you are 3-86 years old, ask your health care provider if you should take aspirin to prevent strokes.  Diabetes screening involves taking a blood sample to check your fasting blood sugar level. This should be done once every 3 years, after age 67, if you are within normal weight and without risk factors for diabetes. Testing should be considered at a younger age or be carried out more frequently if you are overweight and have at least 1 risk factor for diabetes.  Breast cancer screening is essential preventive care for women. You should practice "breast self-awareness." This means understanding the normal appearance and feel of your breasts and may include breast self-examination. Any changes detected, no matter how small, should be reported to a health care provider. Women in their 8s and 30s should have a clinical breast exam (CBE) by a health care provider as part of a regular health exam every 1 to 3 years. After age 70, women should have a CBE every year. Starting at age 25, women should consider having a mammogram (breast X-ray test) every year. Women who have a family history of breast cancer should talk to their health care provider about genetic screening. Women at a high risk of breast cancer should talk to their health care providers about having an MRI and a mammogram every year.  Breast cancer gene (BRCA)-related cancer risk assessment is recommended for women who have family members with BRCA-related cancers. BRCA-related cancers include breast, ovarian, tubal, and peritoneal cancers. Having family members with these cancers may be associated with an increased risk for harmful changes (mutations) in the breast cancer genes BRCA1 and BRCA2. Results of the assessment will determine the need for genetic counseling and  BRCA1 and BRCA2 testing.  Routine pelvic exams to screen for cancer are no longer recommended for nonpregnant women who are considered low risk for cancer of the pelvic organs (ovaries, uterus, and vagina) and who do not have symptoms. Ask your health care provider if a screening pelvic exam is right for you.  If you have had past treatment for cervical cancer or a condition that could lead to cancer, you need Pap tests and screening for cancer for at least 20 years after your treatment. If Pap tests have been discontinued, your risk factors (such as having a new sexual partner) need to be reassessed to determine if screening should be resumed. Some women have medical problems that increase the chance of getting cervical cancer. In these cases, your health care provider may recommend more frequent screening and Pap tests.  The HPV test is an additional test that may be used for cervical cancer screening. The HPV test looks for the virus that can cause the cell changes on the cervix. The cells collected during the Pap test can be  tested for HPV. The HPV test could be used to screen women aged 30 years and older, and should be used in women of any age who have unclear Pap test results. After the age of 30, women should have HPV testing at the same frequency as a Pap test.  Colorectal cancer can be detected and often prevented. Most routine colorectal cancer screening begins at the age of 50 years and continues through age 75 years. However, your health care provider may recommend screening at an earlier age if you have risk factors for colon cancer. On a yearly basis, your health care provider may provide home test kits to check for hidden blood in the stool. Use of a small camera at the end of a tube, to directly examine the colon (sigmoidoscopy or colonoscopy), can detect the earliest forms of colorectal cancer. Talk to your health care provider about this at age 50, when routine screening begins. Direct  exam of the colon should be repeated every 5-10 years through age 75 years, unless early forms of pre-cancerous polyps or small growths are found.  People who are at an increased risk for hepatitis B should be screened for this virus. You are considered at high risk for hepatitis B if:  You were born in a country where hepatitis B occurs often. Talk with your health care provider about which countries are considered high risk.  Your parents were born in a high-risk country and you have not received a shot to protect against hepatitis B (hepatitis B vaccine).  You have HIV or AIDS.  You use needles to inject street drugs.  You live with, or have sex with, someone who has hepatitis B.  You get hemodialysis treatment.  You take certain medicines for conditions like cancer, organ transplantation, and autoimmune conditions.  Hepatitis C blood testing is recommended for all people born from 1945 through 1965 and any individual with known risks for hepatitis C.  Practice safe sex. Use condoms and avoid high-risk sexual practices to reduce the spread of sexually transmitted infections (STIs). STIs include gonorrhea, chlamydia, syphilis, trichomonas, herpes, HPV, and human immunodeficiency virus (HIV). Herpes, HIV, and HPV are viral illnesses that have no cure. They can result in disability, cancer, and death.  You should be screened for sexually transmitted illnesses (STIs) including gonorrhea and chlamydia if:  You are sexually active and are younger than 24 years.  You are older than 24 years and your health care provider tells you that you are at risk for this type of infection.  Your sexual activity has changed since you were last screened and you are at an increased risk for chlamydia or gonorrhea. Ask your health care provider if you are at risk.  If you are at risk of being infected with HIV, it is recommended that you take a prescription medicine daily to prevent HIV infection. This is  called preexposure prophylaxis (PrEP). You are considered at risk if:  You are a heterosexual woman, are sexually active, and are at increased risk for HIV infection.  You take drugs by injection.  You are sexually active with a partner who has HIV.  Talk with your health care provider about whether you are at high risk of being infected with HIV. If you choose to begin PrEP, you should first be tested for HIV. You should then be tested every 3 months for as long as you are taking PrEP.  Osteoporosis is a disease in which the bones lose minerals and strength   with aging. This can result in serious bone fractures or breaks. The risk of osteoporosis can be identified using a bone density scan. Women ages 65 years and over and women at risk for fractures or osteoporosis should discuss screening with their health care providers. Ask your health care provider whether you should take a calcium supplement or vitamin D to reduce the rate of osteoporosis.  Menopause can be associated with physical symptoms and risks. Hormone replacement therapy is available to decrease symptoms and risks. You should talk to your health care provider about whether hormone replacement therapy is right for you.  Use sunscreen. Apply sunscreen liberally and repeatedly throughout the day. You should seek shade when your shadow is shorter than you. Protect yourself by wearing long sleeves, pants, a wide-brimmed hat, and sunglasses year round, whenever you are outdoors.  Once a month, do a whole body skin exam, using a mirror to look at the skin on your back. Tell your health care provider of new moles, moles that have irregular borders, moles that are larger than a pencil eraser, or moles that have changed in shape or color.  Stay current with required vaccines (immunizations).  Influenza vaccine. All adults should be immunized every year.  Tetanus, diphtheria, and acellular pertussis (Td, Tdap) vaccine. Pregnant women should  receive 1 dose of Tdap vaccine during each pregnancy. The dose should be obtained regardless of the length of time since the last dose. Immunization is preferred during the 27th-36th week of gestation. An adult who has not previously received Tdap or who does not know her vaccine status should receive 1 dose of Tdap. This initial dose should be followed by tetanus and diphtheria toxoids (Td) booster doses every 10 years. Adults with an unknown or incomplete history of completing a 3-dose immunization series with Td-containing vaccines should begin or complete a primary immunization series including a Tdap dose. Adults should receive a Td booster every 10 years.  Varicella vaccine. An adult without evidence of immunity to varicella should receive 2 doses or a second dose if she has previously received 1 dose. Pregnant females who do not have evidence of immunity should receive the first dose after pregnancy. This first dose should be obtained before leaving the health care facility. The second dose should be obtained 4-8 weeks after the first dose.  Human papillomavirus (HPV) vaccine. Females aged 13-26 years who have not received the vaccine previously should obtain the 3-dose series. The vaccine is not recommended for use in pregnant females. However, pregnancy testing is not needed before receiving a dose. If a female is found to be pregnant after receiving a dose, no treatment is needed. In that case, the remaining doses should be delayed until after the pregnancy. Immunization is recommended for any person with an immunocompromised condition through the age of 26 years if she did not get any or all doses earlier. During the 3-dose series, the second dose should be obtained 4-8 weeks after the first dose. The third dose should be obtained 24 weeks after the first dose and 16 weeks after the second dose.  Zoster vaccine. One dose is recommended for adults aged 60 years or older unless certain conditions are  present.  Measles, mumps, and rubella (MMR) vaccine. Adults born before 1957 generally are considered immune to measles and mumps. Adults born in 1957 or later should have 1 or more doses of MMR vaccine unless there is a contraindication to the vaccine or there is laboratory evidence of immunity to   each of the three diseases. A routine second dose of MMR vaccine should be obtained at least 28 days after the first dose for students attending postsecondary schools, health care workers, or international travelers. People who received inactivated measles vaccine or an unknown type of measles vaccine during 1963-1967 should receive 2 doses of MMR vaccine. People who received inactivated mumps vaccine or an unknown type of mumps vaccine before 1979 and are at high risk for mumps infection should consider immunization with 2 doses of MMR vaccine. For females of childbearing age, rubella immunity should be determined. If there is no evidence of immunity, females who are not pregnant should be vaccinated. If there is no evidence of immunity, females who are pregnant should delay immunization until after pregnancy. Unvaccinated health care workers born before 1957 who lack laboratory evidence of measles, mumps, or rubella immunity or laboratory confirmation of disease should consider measles and mumps immunization with 2 doses of MMR vaccine or rubella immunization with 1 dose of MMR vaccine.  Pneumococcal 13-valent conjugate (PCV13) vaccine. When indicated, a person who is uncertain of her immunization history and has no record of immunization should receive the PCV13 vaccine. An adult aged 19 years or older who has certain medical conditions and has not been previously immunized should receive 1 dose of PCV13 vaccine. This PCV13 should be followed with a dose of pneumococcal polysaccharide (PPSV23) vaccine. The PPSV23 vaccine dose should be obtained at least 8 weeks after the dose of PCV13 vaccine. An adult aged 19  years or older who has certain medical conditions and previously received 1 or more doses of PPSV23 vaccine should receive 1 dose of PCV13. The PCV13 vaccine dose should be obtained 1 or more years after the last PPSV23 vaccine dose.  Pneumococcal polysaccharide (PPSV23) vaccine. When PCV13 is also indicated, PCV13 should be obtained first. All adults aged 65 years and older should be immunized. An adult younger than age 65 years who has certain medical conditions should be immunized. Any person who resides in a nursing home or long-term care facility should be immunized. An adult smoker should be immunized. People with an immunocompromised condition and certain other conditions should receive both PCV13 and PPSV23 vaccines. People with human immunodeficiency virus (HIV) infection should be immunized as soon as possible after diagnosis. Immunization during chemotherapy or radiation therapy should be avoided. Routine use of PPSV23 vaccine is not recommended for American Indians, [place] Natives, or people younger than 65 years unless there are medical conditions that require PPSV23 vaccine. When indicated, people who have unknown immunization and have no record of immunization should receive PPSV23 vaccine. One-time revaccination 5 years after the first dose of PPSV23 is recommended for people aged 19-64 years who have chronic kidney failure, nephrotic syndrome, asplenia, or immunocompromised conditions. People who received 1-2 doses of PPSV23 before age 65 years should receive another dose of PPSV23 vaccine at age 65 years or later if at least 5 years have passed since the previous dose. Doses of PPSV23 are not needed for people immunized with PPSV23 at or after age 65 years.  Meningococcal vaccine. Adults with asplenia or persistent complement component deficiencies should receive 2 doses of quadrivalent meningococcal conjugate (MenACWY-D) vaccine. The doses should be obtained at least 2 months apart.  Microbiologists working with certain meningococcal bacteria, military recruits, people at risk during an outbreak, and people who travel to or live in countries with a high rate of meningitis should be immunized. A first-year college student up through age   21 years who is living in a residence hall should receive a dose if she did not receive a dose on or after her 16th birthday. Adults who have certain high-risk conditions should receive one or more doses of vaccine.  Hepatitis A vaccine. Adults who wish to be protected from this disease, have certain high-risk conditions, work with hepatitis A-infected animals, work in hepatitis A research labs, or travel to or work in countries with a high rate of hepatitis A should be immunized. Adults who were previously unvaccinated and who anticipate close contact with an international adoptee during the first 60 days after arrival in the [place] from a country with a high rate of hepatitis A should be immunized.  Hepatitis B vaccine. Adults who wish to be protected from this disease, have certain high-risk conditions, may be exposed to blood or other infectious body fluids, are household contacts or sex partners of hepatitis B positive people, are clients or workers in certain care facilities, or travel to or work in countries with a high rate of hepatitis B should be immunized.  Haemophilus influenzae type b (Hib) vaccine. A previously unvaccinated person with asplenia or sickle cell disease or having a scheduled splenectomy should receive 1 dose of Hib vaccine. Regardless of previous immunization, a recipient of a hematopoietic stem cell transplant should receive a 3-dose series 6-12 months after her successful transplant. Hib vaccine is not recommended for adults with HIV infection. Preventive Services / Frequency Ages 64 to 68 years  Blood pressure check.** / Every 1 to 2 years.  Lipid and cholesterol check.** / Every 5 years beginning at age  22.  Clinical breast exam.** / Every 3 years for women in their 88s and 53s.  BRCA-related cancer risk assessment.** / For women who have family members with a BRCA-related cancer (breast, ovarian, tubal, or peritoneal cancers).  Pap test.** / Every 2 years from ages 90 through 51. Every 3 years starting at age 21 through age 56 or 3 with a history of 3 consecutive normal Pap tests.  HPV screening.** / Every 3 years from ages 24 through ages 1 to 46 with a history of 3 consecutive normal Pap tests.  Hepatitis C blood test.** / For any individual with known risks for hepatitis C.  Skin self-exam. / Monthly.  Influenza vaccine. / Every year.  Tetanus, diphtheria, and acellular pertussis (Tdap, Td) vaccine.** / Consult your health care provider. Pregnant women should receive 1 dose of Tdap vaccine during each pregnancy. 1 dose of Td every 10 years.  Varicella vaccine.** / Consult your health care provider. Pregnant females who do not have evidence of immunity should receive the first dose after pregnancy.  HPV vaccine. / 3 doses over 6 months, if 72 and younger. The vaccine is not recommended for use in pregnant females. However, pregnancy testing is not needed before receiving a dose.  Measles, mumps, rubella (MMR) vaccine.** / You need at least 1 dose of MMR if you were born in 1957 or later. You may also need a 2nd dose. For females of childbearing age, rubella immunity should be determined. If there is no evidence of immunity, females who are not pregnant should be vaccinated. If there is no evidence of immunity, females who are pregnant should delay immunization until after pregnancy.  Pneumococcal 13-valent conjugate (PCV13) vaccine.** / Consult your health care provider.  Pneumococcal polysaccharide (PPSV23) vaccine.** / 1 to 2 doses if you smoke cigarettes or if you have certain conditions.  Meningococcal vaccine.** /  1 dose if you are age 19 to 21 years and a first-year college  student living in a residence hall, or have one of several medical conditions, you need to get vaccinated against meningococcal disease. You may also need additional booster doses.  Hepatitis A vaccine.** / Consult your health care provider.  Hepatitis B vaccine.** / Consult your health care provider.  Haemophilus influenzae type b (Hib) vaccine.** / Consult your health care provider. Ages 40 to 64 years  Blood pressure check.** / Every 1 to 2 years.  Lipid and cholesterol check.** / Every 5 years beginning at age 20 years.  Lung cancer screening. / Every year if you are aged 55-80 years and have a 30-pack-year history of smoking and currently smoke or have quit within the past 15 years. Yearly screening is stopped once you have quit smoking for at least 15 years or develop a health problem that would prevent you from having lung cancer treatment.  Clinical breast exam.** / Every year after age 40 years.  BRCA-related cancer risk assessment.** / For women who have family members with a BRCA-related cancer (breast, ovarian, tubal, or peritoneal cancers).  Mammogram.** / Every year beginning at age 40 years and continuing for as long as you are in good health. Consult with your health care provider.  Pap test.** / Every 3 years starting at age 30 years through age 65 or 70 years with a history of 3 consecutive normal Pap tests.  HPV screening.** / Every 3 years from ages 30 years through ages 65 to 70 years with a history of 3 consecutive normal Pap tests.  Fecal occult blood test (FOBT) of stool. / Every year beginning at age 50 years and continuing until age 75 years. You may not need to do this test if you get a colonoscopy every 10 years.  Flexible sigmoidoscopy or colonoscopy.** / Every 5 years for a flexible sigmoidoscopy or every 10 years for a colonoscopy beginning at age 50 years and continuing until age 75 years.  Hepatitis C blood test.** / For all people born from 1945 through  1965 and any individual with known risks for hepatitis C.  Skin self-exam. / Monthly.  Influenza vaccine. / Every year.  Tetanus, diphtheria, and acellular pertussis (Tdap/Td) vaccine.** / Consult your health care provider. Pregnant women should receive 1 dose of Tdap vaccine during each pregnancy. 1 dose of Td every 10 years.  Varicella vaccine.** / Consult your health care provider. Pregnant females who do not have evidence of immunity should receive the first dose after pregnancy.  Zoster vaccine.** / 1 dose for adults aged 60 years or older.  Measles, mumps, rubella (MMR) vaccine.** / You need at least 1 dose of MMR if you were born in 1957 or later. You may also need a 2nd dose. For females of childbearing age, rubella immunity should be determined. If there is no evidence of immunity, females who are not pregnant should be vaccinated. If there is no evidence of immunity, females who are pregnant should delay immunization until after pregnancy.  Pneumococcal 13-valent conjugate (PCV13) vaccine.** / Consult your health care provider.  Pneumococcal polysaccharide (PPSV23) vaccine.** / 1 to 2 doses if you smoke cigarettes or if you have certain conditions.  Meningococcal vaccine.** / Consult your health care provider.  Hepatitis A vaccine.** / Consult your health care provider.  Hepatitis B vaccine.** / Consult your health care provider.  Haemophilus influenzae type b (Hib) vaccine.** / Consult your health care provider. Ages 65   years and over  Blood pressure check.** / Every 1 to 2 years.  Lipid and cholesterol check.** / Every 5 years beginning at age 22 years.  Lung cancer screening. / Every year if you are aged 73-80 years and have a 30-pack-year history of smoking and currently smoke or have quit within the past 15 years. Yearly screening is stopped once you have quit smoking for at least 15 years or develop a health problem that would prevent you from having lung cancer  treatment.  Clinical breast exam.** / Every year after age 4 years.  BRCA-related cancer risk assessment.** / For women who have family members with a BRCA-related cancer (breast, ovarian, tubal, or peritoneal cancers).  Mammogram.** / Every year beginning at age 40 years and continuing for as long as you are in good health. Consult with your health care provider.  Pap test.** / Every 3 years starting at age 9 years through age 34 or 91 years with 3 consecutive normal Pap tests. Testing can be stopped between 65 and 70 years with 3 consecutive normal Pap tests and no abnormal Pap or HPV tests in the past 10 years.  HPV screening.** / Every 3 years from ages 57 years through ages 64 or 45 years with a history of 3 consecutive normal Pap tests. Testing can be stopped between 65 and 70 years with 3 consecutive normal Pap tests and no abnormal Pap or HPV tests in the past 10 years.  Fecal occult blood test (FOBT) of stool. / Every year beginning at age 15 years and continuing until age 17 years. You may not need to do this test if you get a colonoscopy every 10 years.  Flexible sigmoidoscopy or colonoscopy.** / Every 5 years for a flexible sigmoidoscopy or every 10 years for a colonoscopy beginning at age 86 years and continuing until age 71 years.  Hepatitis C blood test.** / For all people born from [birth year] through 1963 and any individual with known risks for hepatitis C.  Osteoporosis screening.** / A one-time screening for women ages 83 years and over and women at risk for fractures or osteoporosis.  Skin self-exam. / Monthly.  Influenza vaccine. / Every year.  Tetanus, diphtheria, and acellular pertussis (Tdap/Td) vaccine.** / 1 dose of Td every 10 years.  Varicella vaccine.** / Consult your health care provider.  Zoster vaccine.** / 1 dose for adults aged 61 years or older.  Pneumococcal 13-valent conjugate (PCV13) vaccine.** / Consult your health care provider.  Pneumococcal  polysaccharide (PPSV23) vaccine.** / 1 dose for all adults aged 28 years and older.  Meningococcal vaccine.** / Consult your health care provider.  Hepatitis A vaccine.** / Consult your health care provider.  Hepatitis B vaccine.** / Consult your health care provider.  Haemophilus influenzae type b (Hib) vaccine.** / Consult your health care provider. ** Family history and personal history of risk and conditions may change your health care provider's recommendations. Document Released: 07/27/2001 Document Revised: 10/15/2013 Document Reviewed: 10/26/2010 [facility] Patient Information 2015 [place], [place]. This information is not intended to replace advice given to you by your health care provider. Make sure you discuss any questions you have with your health care provider.

## 2014-05-15 ENCOUNTER — Telehealth: Payer: Self-pay

## 2014-05-15 LAB — TSH: TSH: 1.55 u[IU]/mL (ref 0.35–4.50)

## 2014-05-15 LAB — BASIC METABOLIC PANEL
BUN: 15 mg/dL (ref 6–23)
CHLORIDE: 104 meq/L (ref 96–112)
CO2: 23 mEq/L (ref 19–32)
Calcium: 9.3 mg/dL (ref 8.4–10.5)
Creatinine, Ser: 0.8 mg/dL (ref 0.4–1.2)
GFR: 98.5 mL/min (ref >60.00)
Glucose, Bld: 70 mg/dL (ref 70–99)
POTASSIUM: 3.4 meq/L — AB (ref 3.5–5.1)
SODIUM: 135 meq/L (ref 135–145)

## 2014-05-15 LAB — CBC WITH DIFFERENTIAL/PLATELET
BASOS PCT: 0.3 % (ref 0.0–3.0)
Basophils Absolute: 0 10*3/uL (ref 0.0–0.1)
EOS PCT: 1 % (ref 0.0–5.0)
Eosinophils Absolute: 0.1 10*3/uL (ref 0.0–0.7)
HEMATOCRIT: 34.6 % — AB (ref 36.0–46.0)
HEMOGLOBIN: 11.2 g/dL — AB (ref 12.0–15.0)
LYMPHS ABS: 2.6 10*3/uL (ref 0.7–4.0)
Lymphocytes Relative: 35.3 % (ref 12.0–46.0)
MCHC: 32.3 g/dL (ref 30.0–36.0)
MCV: 77.8 fl — AB (ref 78.0–100.0)
MONO ABS: 0.4 10*3/uL (ref 0.1–1.0)
MONOS PCT: 5.1 % (ref 3.0–12.0)
NEUTROS ABS: 4.2 10*3/uL (ref 1.4–7.7)
Neutrophils Relative %: 58.3 % (ref 43.0–77.0)
PLATELETS: 287 10*3/uL (ref 150.0–400.0)
RBC: 4.45 Mil/uL (ref 3.87–5.11)
RDW: 15.1 % (ref 11.5–15.5)
WBC: 7.2 10*3/uL (ref 4.0–10.5)

## 2014-05-15 LAB — LIPID PANEL
CHOLESTEROL: 206 mg/dL — AB (ref 0–200)
HDL: 46 mg/dL (ref >39.00)
LDL CALC: 146 mg/dL — AB (ref 0–99)
NonHDL: 160
TRIGLYCERIDES: 68 mg/dL (ref 0.0–149.0)
Total CHOL/HDL Ratio: 4
VLDL: 13.6 mg/dL (ref 0.0–40.0)

## 2014-05-15 LAB — HEPATIC FUNCTION PANEL
ALBUMIN: 4.6 g/dL (ref 3.5–5.2)
ALT: 13 U/L (ref 0–35)
AST: 19 U/L (ref 0–37)
Alkaline Phosphatase: 58 U/L (ref 39–117)
BILIRUBIN TOTAL: 1.1 mg/dL (ref 0.2–1.2)
Bilirubin, Direct: 0 mg/dL (ref 0.0–0.3)
TOTAL PROTEIN: 8.3 g/dL (ref 6.0–8.3)

## 2014-05-15 LAB — URINE CULTURE
Colony Count: NO GROWTH
Organism ID, Bacteria: NO GROWTH

## 2014-05-15 NOTE — Telephone Encounter (Signed)
It can be but easier to remove during period if possible

## 2014-05-15 NOTE — Telephone Encounter (Signed)
Call from the patient and she stated she did not remember you checking her Paragard and wanted to know if it was tile to get it taken out. It was put in in 2006 or 2007. Please advise     KP

## 2014-05-16 LAB — CYTOLOGY - PAP

## 2014-05-16 NOTE — Telephone Encounter (Signed)
MSG left to call.      KP

## 2014-05-17 MED ORDER — METRONIDAZOLE 500 MG PO TABS: 500.0000 mg | ORAL_TABLET | Freq: Two times a day (BID) | ORAL | Status: DC

## 2014-05-17 NOTE — Telephone Encounter (Signed)
Patient has been made aware and voiced understanding.     KP 

## 2014-06-21 ENCOUNTER — Other Ambulatory Visit: Payer: Self-pay

## 2014-06-21 MED ORDER — VALACYCLOVIR HCL 1 G PO TABS: 1000.0000 mg | ORAL_TABLET | Freq: Every day | ORAL | Status: DC

## 2014-06-27 ENCOUNTER — Ambulatory Visit: Payer: BLUE CROSS/BLUE SHIELD | Admitting: Family Medicine

## 2014-06-27 NOTE — Telephone Encounter (Signed)
Apt scheduled tomorrow at 4:15.     KP

## 2014-06-27 NOTE — Telephone Encounter (Signed)
ok 

## 2014-06-27 NOTE — Telephone Encounter (Signed)
Returned call from the patient and she stated she was in her period and would like to have her IUD removed tomorrow. Please advise     KP

## 2014-06-28 ENCOUNTER — Ambulatory Visit (INDEPENDENT_AMBULATORY_CARE_PROVIDER_SITE_OTHER): Payer: BLUE CROSS/BLUE SHIELD | Admitting: Family Medicine

## 2014-06-28 ENCOUNTER — Encounter: Payer: Self-pay | Admitting: Family Medicine

## 2014-06-28 VITALS — BP 109/70 | HR 71 | Temp 98.0°F | Wt 148.8 lb

## 2014-06-28 DIAGNOSIS — Z30432 Encounter for removal of intrauterine contraceptive device: Secondary | ICD-10-CM

## 2014-06-28 NOTE — Progress Notes (Signed)
   Subjective:    Patient ID: Dawn Gray, female    DOB: 12-Aug-1975, 39 y.o.   MRN: [id number]  HPI Pt here to have paraguard IUD removed.  She is currently on her period.  She has had it in for several years.  She does not want to use any other form of contraception.    Review of Systems  Constitutional: Negative for fever, diaphoresis, fatigue and unexpected weight change.  Gastrointestinal: Negative for abdominal pain and abdominal distention.  Genitourinary: Negative for dysuria, urgency, frequency, hematuria, flank pain, decreased urine volume, vaginal discharge, difficulty urinating, genital sores, vaginal pain and pelvic pain.      Objective:   Physical Exam  BP 109/70 mmHg  Pulse 71  Temp(Src) 98 F (36.7 C) (Oral)  Wt 148 lb 12.8 oz (67.495 kg)  SpO2 100% General appearance: alert, cooperative, appears stated age and no distress Pelvic: cervix normal in appearance, external genitalia normal, vagina normal without discharge and + menstruating --- ring forceps used to removed IUD-- no complications.      Assessment & Plan:  IUD removed with no complications               rto prn

## 2014-06-28 NOTE — Progress Notes (Signed)
Pre visit review using our clinic review tool, if applicable. No additional management support is needed unless otherwise documented below in the visit note. 

## 2015-06-30 ENCOUNTER — Telehealth: Payer: Self-pay

## 2015-06-30 DIAGNOSIS — Z3201 Encounter for pregnancy test, result positive: Secondary | ICD-10-CM

## 2015-06-30 NOTE — Telephone Encounter (Signed)
Patient called and stated she had a + and a - home pregnancy test and she wanted to have her blood drawn for confirmation. Is it ok to put the patient on the lab schedule?      KP

## 2015-06-30 NOTE — Telephone Encounter (Signed)
yes

## 2015-06-30 NOTE — Telephone Encounter (Signed)
Patient aware and will schedule a lab visit this week. Unsure of the date yet, she will call back.    KP

## 2015-07-01 ENCOUNTER — Other Ambulatory Visit (INDEPENDENT_AMBULATORY_CARE_PROVIDER_SITE_OTHER): Payer: Managed Care, Other (non HMO)

## 2015-07-01 DIAGNOSIS — Z3201 Encounter for pregnancy test, result positive: Secondary | ICD-10-CM

## 2015-07-02 LAB — HCG, QUANTITATIVE, PREGNANCY: Quantitative HCG: 0.16 m[IU]/mL

## 2015-07-17 ENCOUNTER — Telehealth: Payer: Self-pay | Admitting: Family Medicine

## 2015-07-17 NOTE — Telephone Encounter (Signed)
Chart updated.   KP 

## 2015-07-17 NOTE — Telephone Encounter (Signed)
Pt declines flu shot stating she does not take it.

## 2015-08-21 ENCOUNTER — Encounter: Payer: Self-pay | Admitting: Family Medicine

## 2015-08-21 ENCOUNTER — Ambulatory Visit (INDEPENDENT_AMBULATORY_CARE_PROVIDER_SITE_OTHER): Payer: Managed Care, Other (non HMO) | Admitting: Family Medicine

## 2015-08-21 VITALS — BP 101/64 | HR 65 | Temp 98.0°F | Wt 160.4 lb

## 2015-08-21 DIAGNOSIS — K529 Noninfective gastroenteritis and colitis, unspecified: Secondary | ICD-10-CM

## 2015-08-21 DIAGNOSIS — F410 Panic disorder [episodic paroxysmal anxiety] without agoraphobia: Secondary | ICD-10-CM | POA: Insufficient documentation

## 2015-08-21 DIAGNOSIS — Z3009 Encounter for other general counseling and advice on contraception: Secondary | ICD-10-CM | POA: Diagnosis not present

## 2015-08-21 DIAGNOSIS — K589 Irritable bowel syndrome without diarrhea: Secondary | ICD-10-CM | POA: Diagnosis not present

## 2015-08-21 LAB — POCT URINE PREGNANCY: Preg Test, Ur: NEGATIVE

## 2015-08-21 MED ORDER — ALPRAZOLAM 0.25 MG PO TABS: 0.2500 mg | ORAL_TABLET | Freq: Every evening | ORAL | Status: DC | PRN

## 2015-08-21 MED ORDER — LUBIPROSTONE 24 MCG PO CAPS
24.0000 ug | ORAL_CAPSULE | Freq: Two times a day (BID) | ORAL | Status: DC
Start: 2015-08-21 — End: 2015-08-21

## 2015-08-21 MED ORDER — ONDANSETRON HCL 4 MG PO TABS: 4.0000 mg | ORAL_TABLET | Freq: Three times a day (TID) | ORAL | Status: DC | PRN

## 2015-08-21 MED ORDER — LUBIPROSTONE 24 MCG PO CAPS: 24.0000 ug | ORAL_CAPSULE | Freq: Two times a day (BID) | ORAL | Status: DC

## 2015-08-21 NOTE — Assessment & Plan Note (Signed)
zofran BRAT diet If unable to hold down anything with zofran--- go to ER

## 2015-08-21 NOTE — Assessment & Plan Note (Signed)
Xanax prn Numbers for counselors given to her as well rto 4 weeks or sooner prn

## 2015-08-21 NOTE — Progress Notes (Signed)
Pre visit review using our clinic review tool, if applicable. No additional management support is needed unless otherwise documented below in the visit note. 

## 2015-08-21 NOTE — Patient Instructions (Signed)
Norovirus Infection °A norovirus infection is caused by exposure to a virus in a group of similar viruses (noroviruses). This type of infection causes inflammation in your stomach and intestines (gastroenteritis). Norovirus is the most common cause of gastroenteritis. It also causes food poisoning. °Anyone can get a norovirus infection. It spreads very easily (contagious). You can get it from contaminated food, water, surfaces, or other people. Norovirus is found in the stool or vomit of infected people. You can spread the infection as soon as you feel sick until 2 weeks after you recover.  °Symptoms usually begin within 2 days after you become infected. Most norovirus symptoms affect the digestive system. °CAUSES °Norovirus infection is caused by contact with norovirus. You can catch norovirus if you: °· Eat or drink something contaminated with norovirus. °· Touch surfaces or objects contaminated with norovirus and then put your hand in your mouth. °· Have direct contact with an infected person who has symptoms. °· Share food, drink, or utensils with someone with who is sick with norovirus. °SIGNS AND SYMPTOMS °Symptoms of norovirus may include: °· Nausea. °· Vomiting. °· Diarrhea. °· Stomach cramps. °· Fever. °· Chills. °· Headache. °· Muscle aches. °· Tiredness. °DIAGNOSIS °Your health care provider may suspect norovirus based on your symptoms and physical exam. Your health care provider may also test a sample of your stool or vomit for the virus.  °TREATMENT °There is no specific treatment for norovirus. Most people get better without treatment in about 2 days. °HOME CARE INSTRUCTIONS °· Replace lost fluids by drinking plenty of water or rehydration fluids containing important minerals called electrolytes. This prevents dehydration. Drink enough fluid to keep your urine clear or pale yellow. °· Do not prepare food for others while you are infected. Wait at least 3 days after recovering from the illness to do  that. °PREVENTION  °· Wash your hands often, especially after using the toilet or changing a diaper. °· Wash fruits and vegetables thoroughly before preparing or serving them. °· Throw out any food that a sick person may have touched. °· Disinfect contaminated surfaces immediately after someone in the household has been sick. Use a bleach-based household cleaner. °· Immediately remove and wash soiled clothes or sheets. °SEEK MEDICAL CARE IF: °· Your vomiting, diarrhea, and stomach pain is getting worse. °· Your symptoms of norovirus do not go away after 2-3 days. °SEEK IMMEDIATE MEDICAL CARE IF:  °You develop symptoms of dehydration that do not improve with fluid replacement. This may include: °· Excessive sleepiness. °· Lack of tears. °· Dry mouth. °· Dizziness when standing. °· Weak pulse. °  °This information is not intended to replace advice given to you by your health care provider. Make sure you discuss any questions you have with your health care provider. °  °Document Released: 08/21/2002 Document Revised: 06/21/2014 Document Reviewed: 11/08/2013 °Elsevier Interactive Patient Education ©2016 [employer] Inc[employer] ° °

## 2015-08-21 NOTE — Progress Notes (Signed)
Patient ID: Dawn Gray, female    DOB: November 12, 1975  Age: 40 y.o. MRN: [id number]    Subjective:  Subjective HPI Makinzee Arbuthnot presents f/u from ems going to her house.  She had a panic attack.  She had nausea and vomiting and then started hyperventilating.  Her husband said she has been under a lot of stress and her friend said part of the stress is that she recently got married to a man with MS and the stress of needing to take care of him and her kids and having a stressful job came to a head last night.     Review of Systems  Constitutional: Negative for diaphoresis, appetite change, fatigue and unexpected weight change.  Eyes: Negative for pain, redness and visual disturbance.  Respiratory: Negative for cough, chest tightness, shortness of breath and wheezing.   Cardiovascular: Negative for chest pain, palpitations and leg swelling.  Endocrine: Negative for cold intolerance, heat intolerance, polydipsia, polyphagia and polyuria.  Genitourinary: Negative for dysuria, frequency and difficulty urinating.  Neurological: Negative for dizziness, light-headedness, numbness and headaches.    History Past Medical History  Diagnosis Date  . Anemia   . Bronchitis     Hx of Chronic  . GERD (gastroesophageal reflux disease)   . Environmental allergies     seasonal  . Cat allergies   . Migraine   . 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   Her family history includes Arthritis in her mother and paternal grandmother; Cancer in her maternal grandfather; Diabetes in her father, mother, and paternal grandmother; Berenice Primas' disease in her father; Heart disease in her maternal uncle; Hyperlipidemia in her father, maternal grandfather, paternal grandfather, and paternal grandmother; Hypertension in her father and mother; Lung cancer in her paternal grandmother; Stroke in her maternal grandmother and paternal  grandmother. There is no history of Colon cancer.She reports that she has never smoked. She has never used smokeless tobacco. She reports that she drinks alcohol. She reports that she does not use illicit drugs.  Current Outpatient Prescriptions on File Prior to Visit  Medication Sig Dispense Refill  . albuterol (PROAIR HFA) 108 (90 BASE) MCG/ACT inhaler Inhale 2 puffs into the lungs every 6 (six) hours as needed for wheezing or shortness of breath. 1 Inhaler 5  . valACYclovir (VALTREX) 1000 MG tablet Take 1 tablet (1,000 mg total) by mouth daily. 30 tablet 11   No current facility-administered medications on file prior to visit.     Objective:  Objective Physical Exam  Constitutional: She is oriented to person, place, and time. She appears well-developed and well-nourished.  HENT:  Head: Normocephalic and atraumatic.  Eyes: Conjunctivae and EOM are normal.  Neck: Normal range of motion. Neck supple. No JVD present. Carotid bruit is not present. No thyromegaly present.  Cardiovascular: Normal rate, regular rhythm and normal heart sounds.   No murmur heard. Pulmonary/Chest: Effort normal and breath sounds normal. No respiratory distress. She has no wheezes. She has no rales. She exhibits no tenderness.  Musculoskeletal: She exhibits no edema.  Neurological: She is alert and oriented to person, place, and time.  Psychiatric: Her mood appears anxious.  Nursing note and vitals reviewed.  BP 101/64 mmHg  Pulse 65  Temp(Src) 98 F (36.7 C) (Oral)  Wt 160 lb 6.4 oz (72.757 kg)  SpO2 96%  LMP 08/09/2015 Wt Readings from Last 3 Encounters:  08/21/15  160 lb 6.4 oz (72.757 kg)  06/28/14 148 lb 12.8 oz (67.495 kg)  05/14/14 144 lb (65.318 kg)     Lab Results  Component Value Date   WBC 7.2 05/14/2014   HGB 11.2* 05/14/2014   HCT 34.6* 05/14/2014   PLT 287.0 05/14/2014   GLUCOSE 70 05/14/2014   CHOL 206* 05/14/2014   TRIG 68.0 05/14/2014   HDL 46.00 05/14/2014   LDLCALC 146*  05/14/2014   ALT 13 05/14/2014   AST 19 05/14/2014   NA 135 05/14/2014   K 3.4* 05/14/2014   CL 104 05/14/2014   CREATININE 0.8 05/14/2014   BUN 15 05/14/2014   CO2 23 05/14/2014   TSH 1.55 05/14/2014   INR 0.93 06/24/2011    Dg Chest 2 View  05/15/2014  CLINICAL DATA:  Left-sided chest pain. 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 EXAM: CHEST - 2 VIEW COMPARISON:  11/02/2012 FINDINGS: The heart size and mediastinal contours are within normal limits. Stable clips overlying the left upper chest. There is no evidence of pulmonary edema, consolidation, pneumothorax, nodule or pleural fluid. The visualized skeletal structures are unremarkable. IMPRESSION: No active disease. Electronically Signed   By: Aletta Edouard M.D.   On: 05/15/2014 08:33     Assessment & Plan:  Plan I am having Ms. Perry start on ondansetron and ALPRAZolam. I am also having her maintain her albuterol, valACYclovir, and lubiprostone.  Meds ordered this encounter  Medications  . DISCONTD: lubiprostone (AMITIZA) 24 MCG capsule    Sig: Take 1 capsule (24 mcg total) by mouth 2 (two) times daily with a meal.    Dispense:  60 capsule    Refill:  3  . ondansetron (ZOFRAN) 4 MG tablet    Sig: Take 1 tablet (4 mg total) by mouth every 8 (eight) hours as needed for nausea or vomiting.    Dispense:  20 tablet    Refill:  0  . DISCONTD: lubiprostone (AMITIZA) 24 MCG capsule    Sig: Take 1 capsule (24 mcg total) by mouth 2 (two) times daily with a meal.    Dispense:  60 capsule    Refill:  3  . lubiprostone (AMITIZA) 24 MCG capsule    Sig: Take 1 capsule (24 mcg total) by mouth 2 (two) times daily with a meal.    Dispense:  180 capsule    Refill:  3  . ALPRAZolam (XANAX) 0.25 MG tablet    Sig: Take 1 tablet (0.25 mg total) by mouth at bedtime as needed for anxiety.    Dispense:  30 tablet    Refill:  0    Problem List Items Addressed This Visit    None    Visit Diagnoses    IBS (irritable bowel  syndrome)    -  Primary    Relevant Medications    ondansetron (ZOFRAN) 4 MG tablet    lubiprostone (AMITIZA) 24 MCG capsule    Family planning counseling        Relevant Orders    Ambulatory referral to Obstetrics / Gynecology    POCT urine pregnancy (Completed)    Gastroenteritis        Relevant Medications    ondansetron (ZOFRAN) 4 MG tablet    Panic attack        Relevant Medications    ALPRAZolam (XANAX) 0.25 MG tablet       Follow-up: Return in about 4 weeks (around 09/18/2015), or if symptoms worsen or fail to improve, for  anxiety.  Garnet Koyanagi, DO

## 2015-08-27 ENCOUNTER — Telehealth: Payer: Self-pay | Admitting: *Deleted

## 2015-08-27 NOTE — Telephone Encounter (Signed)
Received FMLA paperwork, completed as much as possible; forwarded to provider/SLS 03/15

## 2015-09-10 ENCOUNTER — Ambulatory Visit (INDEPENDENT_AMBULATORY_CARE_PROVIDER_SITE_OTHER): Payer: Managed Care, Other (non HMO) | Admitting: Obstetrics & Gynecology

## 2015-09-10 ENCOUNTER — Encounter: Payer: Self-pay | Admitting: Obstetrics & Gynecology

## 2015-09-10 VITALS — BP 130/69 | HR 102 | Ht 64.5 in | Wt 165.0 lb

## 2015-09-10 DIAGNOSIS — Z01419 Encounter for gynecological examination (general) (routine) without abnormal findings: Secondary | ICD-10-CM

## 2015-09-10 DIAGNOSIS — N910 Primary amenorrhea: Secondary | ICD-10-CM

## 2015-09-10 DIAGNOSIS — Z124 Encounter for screening for malignant neoplasm of cervix: Secondary | ICD-10-CM

## 2015-09-10 DIAGNOSIS — Z113 Encounter for screening for infections with a predominantly sexual mode of transmission: Secondary | ICD-10-CM

## 2015-09-10 DIAGNOSIS — N926 Irregular menstruation, unspecified: Secondary | ICD-10-CM

## 2015-09-10 DIAGNOSIS — Z0000 Encounter for general adult medical examination without abnormal findings: Secondary | ICD-10-CM

## 2015-09-10 NOTE — Telephone Encounter (Signed)
Pt came in office wanting to know if FMLA documents where filled out, pt was informed the below, pt understood. Pt stated was at the Gynecologist today (09-10-15) and is needing to add on her paperwork FMLA that she is pregnant too.(test came out positive). Pt states found out today at her appt. Paperwork is due on September 16, 2015. Please advise.

## 2015-09-10 NOTE — Progress Notes (Signed)
Subjective:    Dawn Gray is a 40 y.o.  M AA P3 (19, 19, 36 yo kids) female who presents for an annual exam. The patient has no complaints today. She would like to conceive. The patient is sexually active. GYN screening history: last pap: was normal. The patient wears seatbelts: yes. The patient participates in regular exercise: yes. Has the patient ever been transfused or tattooed?: yes. The patient reports that there is not domestic violence in her life.   Menstrual History: OB History    Gravida Para Term Preterm AB TAB SAB Ectopic Multiple Living   5 3   2  2   3       Menarche age: 50  Patient's last menstrual period was 08/09/2015.    The following portions of the patient's history were reviewed and updated as appropriate: allergies, current medications, past family history, past medical history, past social history, past surgical history and problem list.  Review of Systems Pertinent items are noted in HPI.  Declines flu vaccine. Works at [employer] (previously [employer] Periods used to be regular until 9/16 when she got married. Now they are sporadic, still once per month.   Objective:    BP 130/69 mmHg  Pulse 102  Ht 5' 4.5" (1.638 m)  Wt 165 lb (74.844 kg)  BMI 27.90 kg/m2  LMP 08/09/2015  General Appearance:    Alert, cooperative, no distress, appears stated age  Head:    Normocephalic, without obvious abnormality, atraumatic  Eyes:    PERRL, conjunctiva/corneas clear, EOM's intact, fundi    benign, both eyes  Ears:    Normal TM's and external ear canals, both ears  Nose:   Nares normal, septum midline, mucosa normal, no drainage    or sinus tenderness  Throat:   Lips, mucosa, and tongue normal; teeth and gums normal  Neck:   Supple, symmetrical, trachea midline, no adenopathy;    thyroid:  no enlargement/tenderness/nodules; no carotid   bruit or JVD  Back:     Symmetric, no curvature, ROM normal, no CVA tenderness  Lungs:     Clear to auscultation bilaterally,  respirations unlabored  Chest Wall:    No tenderness or deformity   Heart:    Regular rate and rhythm, S1 and S2 normal, no murmur, rub   or gallop  Breast Exam:    No tenderness, masses, or nipple abnormality  Abdomen:     Soft, non-tender, bowel sounds active all four quadrants,    no masses, no organomegaly  Genitalia:    Normal female without lesion, discharge or tenderness, NSSA, NT     Extremities:   Extremities normal, atraumatic, no cyanosis or edema  Pulses:   2+ and symmetric all extremities  Skin:   Skin color, texture, turgor normal, no rashes or lesions  Lymph nodes:   Cervical, supraclavicular, and axillary nodes normal  Neurologic:   CNII-XII intact, normal strength, sensation and reflexes    throughout  .    Assessment:    Healthy female exam.   [redacted] weeks EGA   Plan:     Breast self exam technique reviewed and patient encouraged to perform self-exam monthly. Thin prep Pap smear. with cotesting Schedule NOB visit in 4 weeks

## 2015-09-12 LAB — CYTOLOGY - PAP

## 2015-09-12 NOTE — Telephone Encounter (Signed)
Called patient and informed her that I received her request and have taken care of; pt states that paperwork is [place] how it is, she is going to get new paperwork for her OB/GYN and pregnancy FMLA needed. Informed patient that she did not complete page 1-Employee section and that her signature would be required , as well as on the Medical Release form included. Pt request pages be faxed to her and she will sign and return; fax done, provider will need to complete her signatures as well when she arrives in office today/SLS 03/31

## 2015-09-18 ENCOUNTER — Ambulatory Visit: Payer: Managed Care, Other (non HMO) | Admitting: Family Medicine

## 2015-09-30 ENCOUNTER — Encounter: Payer: BLUE CROSS/BLUE SHIELD | Admitting: Family Medicine

## 2015-10-07 ENCOUNTER — Ambulatory Visit (INDEPENDENT_AMBULATORY_CARE_PROVIDER_SITE_OTHER): Payer: Managed Care, Other (non HMO) | Admitting: Obstetrics & Gynecology

## 2015-10-07 ENCOUNTER — Encounter: Payer: Self-pay | Admitting: Obstetrics & Gynecology

## 2015-10-07 VITALS — BP 120/65 | HR 89 | Wt 166.0 lb

## 2015-10-07 DIAGNOSIS — Z113 Encounter for screening for infections with a predominantly sexual mode of transmission: Secondary | ICD-10-CM | POA: Diagnosis not present

## 2015-10-07 DIAGNOSIS — O09529 Supervision of elderly multigravida, unspecified trimester: Secondary | ICD-10-CM | POA: Insufficient documentation

## 2015-10-07 DIAGNOSIS — O09521 Supervision of elderly multigravida, first trimester: Secondary | ICD-10-CM

## 2015-10-07 DIAGNOSIS — Z3481 Encounter for supervision of other normal pregnancy, first trimester: Secondary | ICD-10-CM

## 2015-10-07 DIAGNOSIS — Z3480 Encounter for supervision of other normal pregnancy, unspecified trimester: Secondary | ICD-10-CM

## 2015-10-07 DIAGNOSIS — Z36 Encounter for antenatal screening of mother: Secondary | ICD-10-CM | POA: Diagnosis not present

## 2015-10-07 NOTE — Progress Notes (Signed)
Subjective:    Dawn Gray is a [id number] [redacted]w[redacted]d being seen today for her first obstetrical visit.  Her obstetrical history is significant for advanced maternal age. Patient does intend to breast feed. Pregnancy history fully reviewed.  Patient reports no complaints.  Filed Vitals:   10/07/15 1431  BP: 120/65  Pulse: 89  Weight: 166 lb (75.297 kg)    HISTORY: OB History  Gravida Para Term Preterm AB SAB TAB Ectopic Multiple Living  6 3   2 2    3     # Outcome Date GA Lbr Len/2nd Weight Sex Delivery Anes PTL Lv  6 Current           5 SAB           4 SAB           3 Para      Vag-Spont     2 Para      Vag-Spont     1 Para      Vag-Spont        Past Medical History  Diagnosis Date  . Anemia   . Bronchitis     Hx of Chronic  . GERD (gastroesophageal reflux disease)   . Environmental allergies     seasonal  . Cat allergies   . Migraine   . 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 Tumor removal  01/2009    From Lung and Heart   Family History  Problem Relation Age of Onset  . Hypertension Mother   . Diabetes Mother   . Arthritis Mother   . Diabetes Father   . Hypertension Father   . Hyperlipidemia Father   . Graves' disease Father   . Arthritis Paternal Grandmother   . Hyperlipidemia Paternal Grandmother   . Stroke Paternal Grandmother   . Lung cancer Paternal Grandmother     brain mets  . Hyperlipidemia Maternal Grandfather   . Cancer Maternal Grandfather     unsure what kind  . Hyperlipidemia Paternal Grandfather   . Heart disease Maternal Uncle   . Stroke Maternal Grandmother   . Diabetes Paternal Grandmother   . Colon cancer Neg Hx      Exam    Uterus:     Pelvic Exam:    Perineum: No Hemorrhoids   Vulva: normal   Vagina:  normal mucosa   pH:    Cervix: anteverted   Adnexa: normal adnexa   Bony Pelvis: android  System: Breast:  normal appearance, no masses or  tenderness   Skin: normal coloration and turgor, no rashes    Neurologic: oriented   Extremities: normal strength, tone, and muscle mass   HEENT PERRLA   Mouth/Teeth mucous membranes moist, pharynx normal without lesions   Neck supple   Cardiovascular: regular rate and rhythm   Respiratory:  appears well, vitals normal, no respiratory distress, acyanotic, normal RR, ear and throat exam is normal, neck free of mass or lymphadenopathy, chest clear, no wheezing, crepitations, rhonchi, normal symmetric air entry   Abdomen: soft, non-tender; bowel sounds normal; no masses,  no organomegaly   Urinary: urethral meatus normal      Assessment:    Pregnancy: [id number] Patient Active Problem List   Diagnosis Date Noted  . Supervision of other normal pregnancy, antepartum 10/07/2015  . Panic attack 08/21/2015  . Gastroenteritis 08/21/2015  . Schwannoma 04/30/2013  . Chronic constipation 07/13/2012  Plan:     Initial labs drawn. Prenatal vitamins. Problem list reviewed and updated. Genetic Screening discussed . She will consider NIPS  Ultrasound discussed; fetal survey: requested.  Follow up in 4 weeks. She will consider Baby Scripts   Yuette Putnam C. 10/07/2015

## 2015-10-07 NOTE — Progress Notes (Signed)
Bedside U/S today shows IUP with FHT of 175 BPM.  CRL is 21.18mm and GA is [redacted]w[redacted]d.

## 2015-10-08 LAB — OBSTETRIC PANEL
Antibody Screen: NEGATIVE
BASOS PCT: 0 %
Basophils Absolute: 0 cells/uL (ref 0–200)
EOS ABS: 184 {cells}/uL (ref 15–500)
EOS PCT: 2 %
HEMATOCRIT: 34.8 % — AB (ref 35.0–45.0)
HEMOGLOBIN: 11.1 g/dL — AB (ref 11.7–15.5)
Hepatitis B Surface Ag: NEGATIVE
Lymphocytes Relative: 25 %
Lymphs Abs: 2300 cells/uL (ref 850–3900)
MCH: 25.3 pg — AB (ref 27.0–33.0)
MCHC: 31.9 g/dL — AB (ref 32.0–36.0)
MCV: 79.3 fL — AB (ref 80.0–100.0)
MONOS PCT: 8 %
MPV: 10.7 fL (ref 7.5–12.5)
Monocytes Absolute: 736 cells/uL (ref 200–950)
NEUTROS ABS: 5980 {cells}/uL (ref 1500–7800)
Neutrophils Relative %: 65 %
Platelets: 263 10*3/uL (ref 140–400)
RBC: 4.39 MIL/uL (ref 3.80–5.10)
RDW: 14.7 % (ref 11.0–15.0)
RUBELLA: 1.09 {index} — AB (ref <0.90)
Rh Type: POSITIVE
WBC: 9.2 10*3/uL (ref 3.8–10.8)

## 2015-10-08 LAB — HIV ANTIBODY (ROUTINE TESTING W REFLEX): HIV 1&2 Ab, 4th Generation: NONREACTIVE

## 2015-10-09 LAB — URINE CYTOLOGY ANCILLARY ONLY
CHLAMYDIA, DNA PROBE: NEGATIVE
Neisseria Gonorrhea: NEGATIVE

## 2015-10-09 LAB — URINE CULTURE
COLONY COUNT: NO GROWTH
ORGANISM ID, BACTERIA: NO GROWTH

## 2015-10-15 ENCOUNTER — Encounter: Payer: Self-pay | Admitting: *Deleted

## 2015-10-19 IMAGING — CR DG CHEST 2V
2 series · 2 of 2 positions shown · non-contrast
Comparison: 11/02/2012

CLINICAL DATA: Left-sided chest pain. History of removal of
schwannoma of left chest wall.

EXAM:
CHEST - 2 VIEW

[w chest pa]
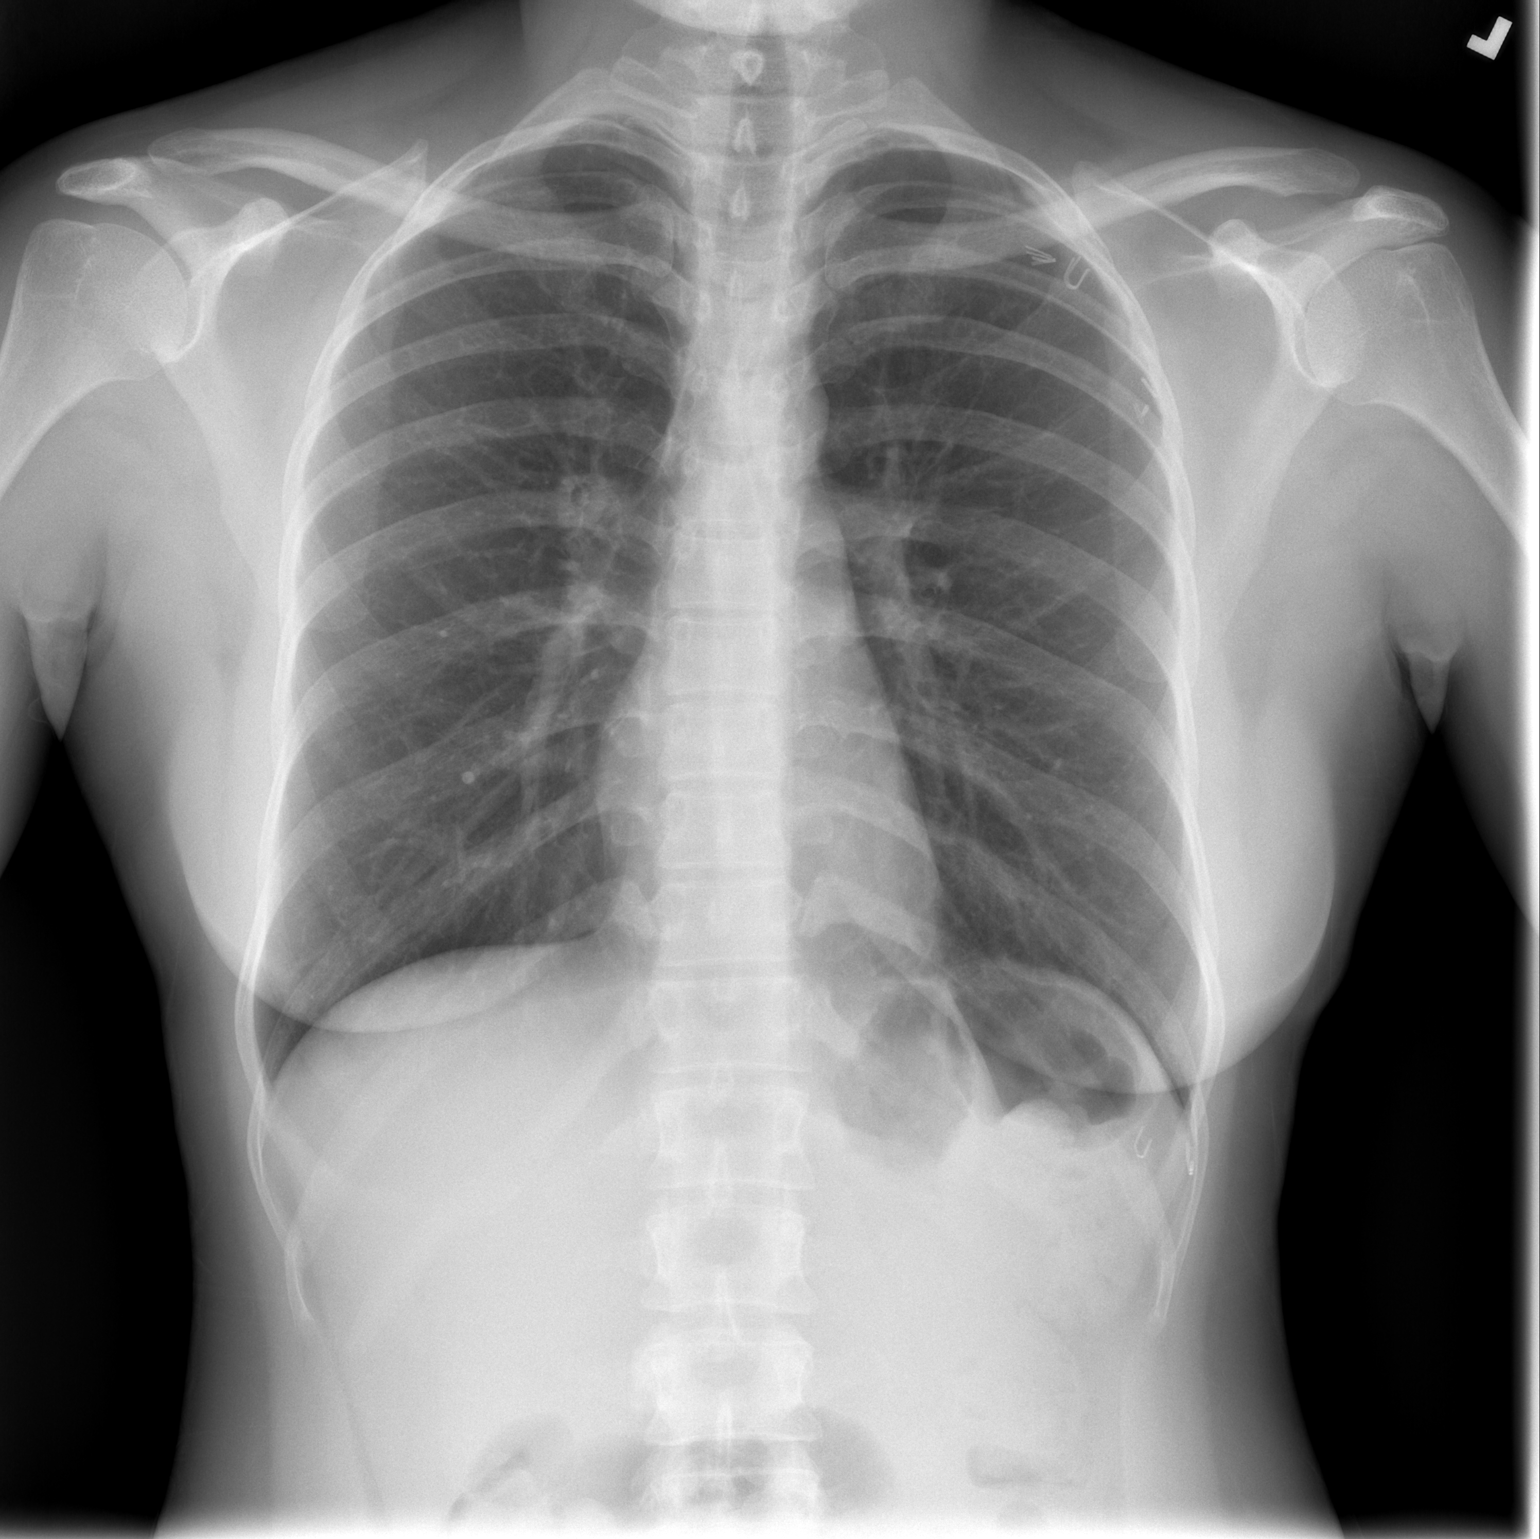

[w chest lat]
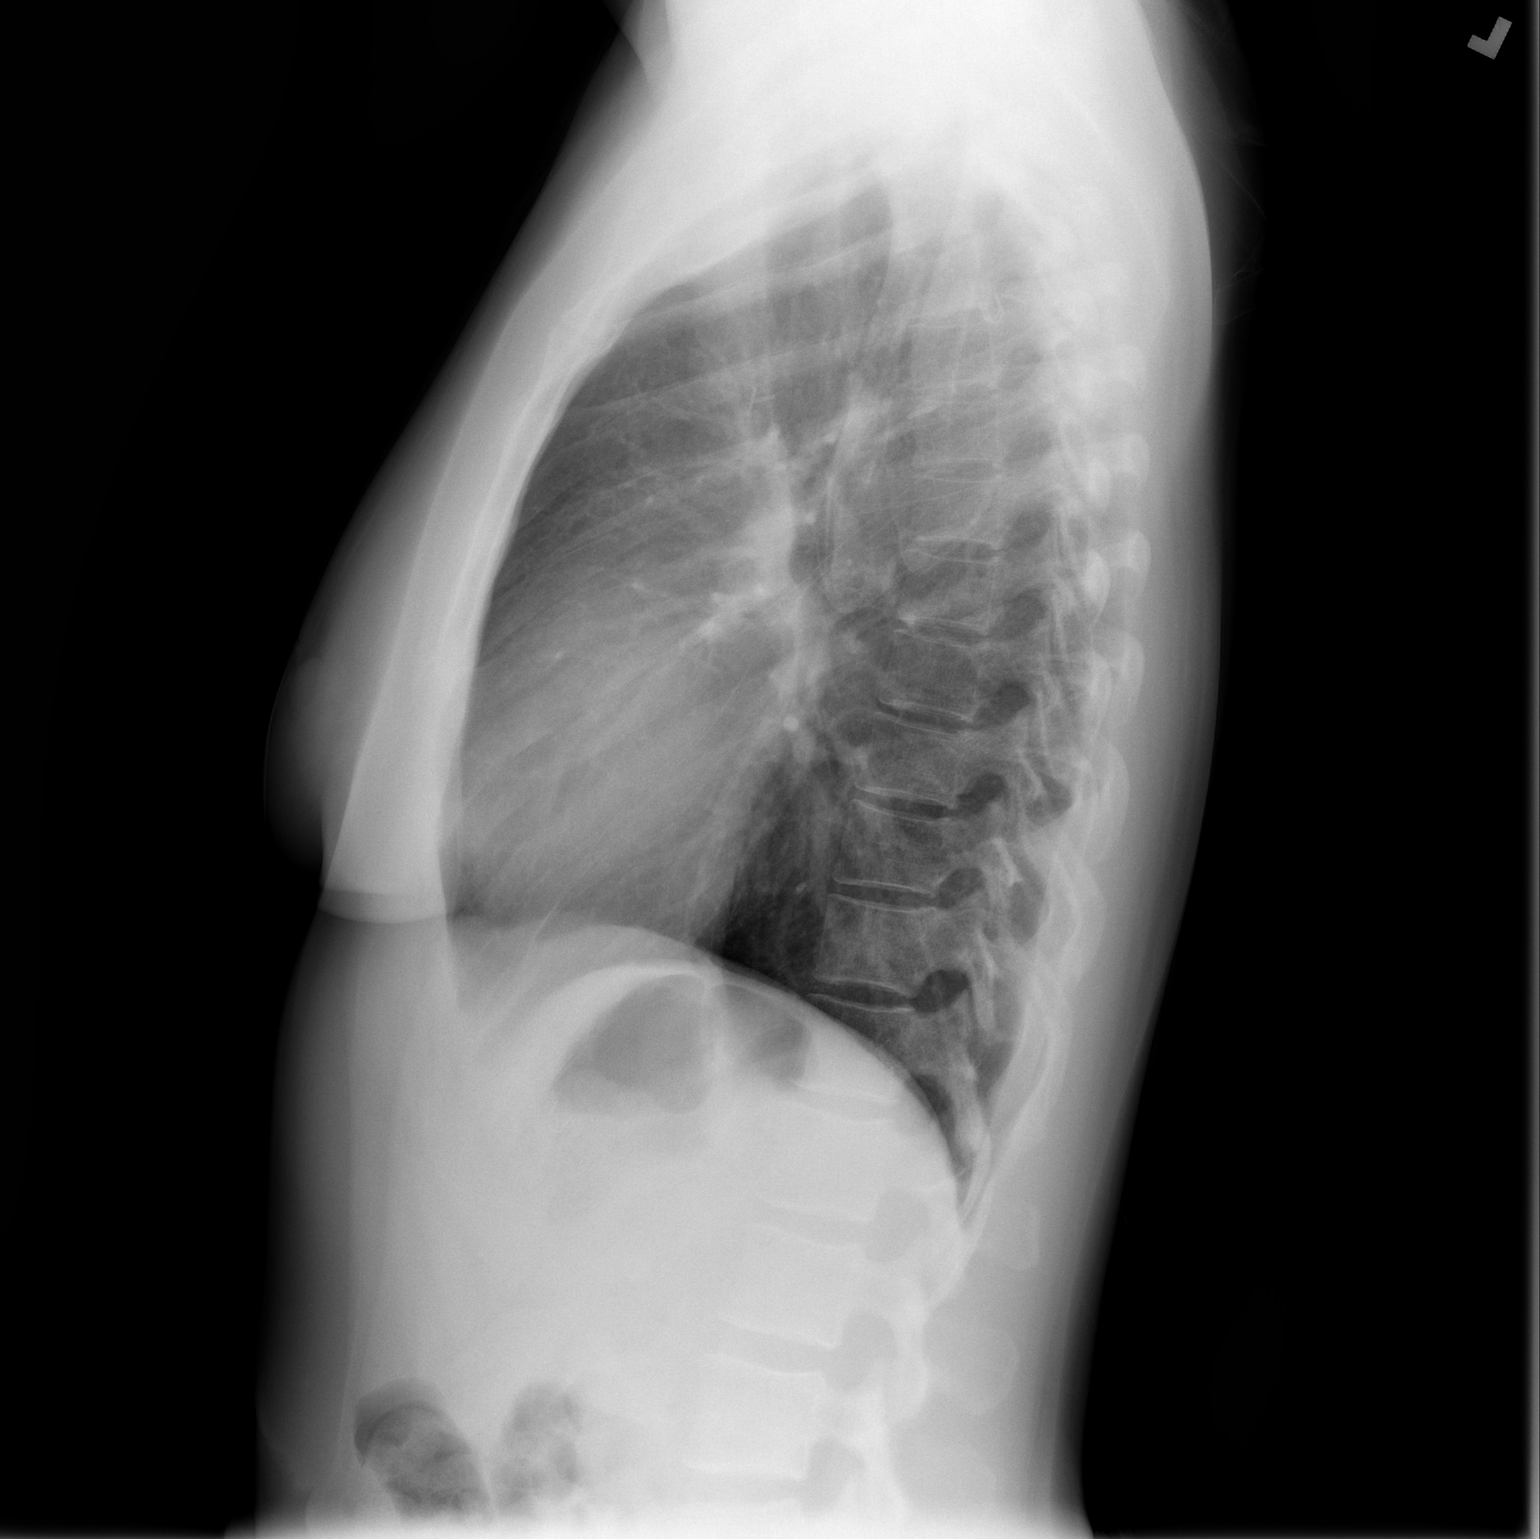

[2 of 2 positions shown; findings below may reference images not displayed]

FINDINGS: The heart size and mediastinal contours are within normal limits.
Stable clips overlying the left upper chest. There is no evidence of
pulmonary edema, consolidation, pneumothorax, nodule or pleural
fluid. The visualized skeletal structures are unremarkable.
IMPRESSION: No active disease.

## 2015-11-07 ENCOUNTER — Encounter: Payer: Managed Care, Other (non HMO) | Admitting: Certified Nurse Midwife

## 2015-11-12 ENCOUNTER — Ambulatory Visit (INDEPENDENT_AMBULATORY_CARE_PROVIDER_SITE_OTHER): Payer: Managed Care, Other (non HMO) | Admitting: Obstetrics & Gynecology

## 2015-11-12 ENCOUNTER — Encounter: Payer: Self-pay | Admitting: *Deleted

## 2015-11-12 ENCOUNTER — Encounter: Payer: Self-pay | Admitting: Obstetrics & Gynecology

## 2015-11-12 VITALS — BP 132/72 | HR 96 | Wt 171.0 lb

## 2015-11-12 DIAGNOSIS — Z3482 Encounter for supervision of other normal pregnancy, second trimester: Secondary | ICD-10-CM | POA: Diagnosis not present

## 2015-11-12 DIAGNOSIS — Z36 Encounter for antenatal screening of mother: Secondary | ICD-10-CM | POA: Diagnosis not present

## 2015-11-12 DIAGNOSIS — A6000 Herpesviral infection of urogenital system, unspecified: Secondary | ICD-10-CM | POA: Insufficient documentation

## 2015-11-12 MED ORDER — NATACHEW 28-1 MG PO CHEW: 1.0000 | CHEWABLE_TABLET | Freq: Every day | ORAL | Status: DC

## 2015-11-12 NOTE — Progress Notes (Signed)
Subjective:  Dawn Gray is a 40 y.o. [phone] at [redacted]w[redacted]d being seen today for ongoing prenatal care.  She is currently monitored for the following issues for this low-risk pregnancy and has Chronic constipation; Schwannoma; Panic attack; Gastroenteritis; Supervision of other normal pregnancy, antepartum; AMA (advanced maternal age) multigravida 35+; and Herpes genitalia on her problem list.  Patient reports mild nausea and headaches; pt doesn't want to take meds at this time..   Krista Blue. Bleeding: None.  Movement: Absent. Denies leaking of fluid.   The following portions of the patient's history were reviewed and updated as appropriate: allergies, current medications, past family history, past medical history, past social history, past surgical history and problem list. Problem list updated.  Objective:   Filed Vitals:   11/12/15 0946  BP: 132/72  Pulse: 96  Weight: 171 lb (77.565 kg)    Fetal Status: Fetal Heart Rate (bpm): 152   Movement: Absent     General:  Alert, oriented and cooperative. Patient is in no acute distress.  Skin: Skin is warm and dry. No rash noted.   Cardiovascular: Normal heart rate noted  Respiratory: Normal respiratory effort, no problems with respiration noted  Abdomen: Soft, gravid, appropriate for gestational age. Pain/Pressure: Absent     Pelvic: Vag. Bleeding: None Vag D/C Character: Thin   Cervical exam deferred        Extremities: Normal range of motion.  Edema: None  Mental Status: Normal mood and affect. Normal behavior. Normal judgment and thought content.   Urinalysis: Urine Protein: Trace Urine Glucose: Negative  Assessment and Plan:  Pregnancy: [id number] at [redacted]w[redacted]d  1. Supervision of other normal pregnancy, antepartum, second trimester -Pt doesn't want to know gender under gender reveal party - [place] MFM OB COMP + 82 WK; Future - Genetic Screening  2. Herpes genitalia Husband aware Valtrex at 35 weeks  3.  Pt enrolled in [employer].  Preterm  labor symptoms and general obstetric precautions including but not limited to vaginal bleeding, contractions, leaking of fluid and fetal movement were reviewed in detail with the patient. Please refer to After Visit Summary for other counseling recommendations.  No Follow-up on file.   Guss Bunde, MD

## 2015-11-14 ENCOUNTER — Encounter: Payer: Managed Care, Other (non HMO) | Admitting: Family

## 2015-11-26 ENCOUNTER — Encounter: Payer: Self-pay | Admitting: *Deleted

## 2015-11-26 DIAGNOSIS — Z3482 Encounter for supervision of other normal pregnancy, second trimester: Secondary | ICD-10-CM

## 2015-11-26 NOTE — Progress Notes (Signed)
Pt notified of normal [place]- Pt having a gender reveal and sex of baby put in sealed envelope

## 2015-12-03 ENCOUNTER — Encounter (HOSPITAL_COMMUNITY): Payer: Self-pay | Admitting: Obstetrics & Gynecology

## 2015-12-12 ENCOUNTER — Other Ambulatory Visit: Payer: Self-pay | Admitting: Obstetrics & Gynecology

## 2015-12-12 ENCOUNTER — Ambulatory Visit (HOSPITAL_COMMUNITY)
Admission: RE | Admit: 2015-12-12 | Discharge: 2015-12-12 | Disposition: A | Discharge status: Home / Self Care | Payer: Managed Care, Other (non HMO) | Source: Ambulatory Visit | Attending: Obstetrics & Gynecology | Admitting: Obstetrics & Gynecology

## 2015-12-12 DIAGNOSIS — Z36 Encounter for antenatal screening of mother: Secondary | ICD-10-CM | POA: Diagnosis not present

## 2015-12-12 DIAGNOSIS — Z3689 Encounter for other specified antenatal screening: Secondary | ICD-10-CM

## 2015-12-12 DIAGNOSIS — Z3A18 18 weeks gestation of pregnancy: Secondary | ICD-10-CM | POA: Insufficient documentation

## 2015-12-12 DIAGNOSIS — Z3482 Encounter for supervision of other normal pregnancy, second trimester: Secondary | ICD-10-CM

## 2015-12-12 DIAGNOSIS — O09522 Supervision of elderly multigravida, second trimester: Secondary | ICD-10-CM | POA: Insufficient documentation

## 2015-12-17 ENCOUNTER — Inpatient Hospital Stay (HOSPITAL_COMMUNITY)
Admission: AD | Admit: 2015-12-17 | Discharge: 2015-12-17 | Disposition: A | Discharge status: Home / Self Care | Payer: Managed Care, Other (non HMO) | Source: Ambulatory Visit | Attending: Obstetrics & Gynecology | Admitting: Obstetrics & Gynecology

## 2015-12-17 ENCOUNTER — Encounter (HOSPITAL_COMMUNITY): Payer: Self-pay | Admitting: *Deleted

## 2015-12-17 DIAGNOSIS — R102 Pelvic and perineal pain: Secondary | ICD-10-CM | POA: Insufficient documentation

## 2015-12-17 DIAGNOSIS — M791 Myalgia: Secondary | ICD-10-CM

## 2015-12-17 DIAGNOSIS — O99612 Diseases of the digestive system complicating pregnancy, second trimester: Secondary | ICD-10-CM | POA: Diagnosis not present

## 2015-12-17 DIAGNOSIS — K219 Gastro-esophageal reflux disease without esophagitis: Secondary | ICD-10-CM | POA: Diagnosis not present

## 2015-12-17 DIAGNOSIS — O9989 Other specified diseases and conditions complicating pregnancy, childbirth and the puerperium: Secondary | ICD-10-CM | POA: Diagnosis not present

## 2015-12-17 DIAGNOSIS — Z3A15 15 weeks gestation of pregnancy: Secondary | ICD-10-CM | POA: Insufficient documentation

## 2015-12-17 DIAGNOSIS — O26892 Other specified pregnancy related conditions, second trimester: Secondary | ICD-10-CM | POA: Insufficient documentation

## 2015-12-17 DIAGNOSIS — O99891 Other specified diseases and conditions complicating pregnancy: Secondary | ICD-10-CM

## 2015-12-17 DIAGNOSIS — R109 Unspecified abdominal pain: Secondary | ICD-10-CM | POA: Diagnosis present

## 2015-12-17 DIAGNOSIS — N949 Unspecified condition associated with female genital organs and menstrual cycle: Secondary | ICD-10-CM

## 2015-12-17 DIAGNOSIS — M7918 Myalgia, other site: Secondary | ICD-10-CM

## 2015-12-17 HISTORY — DX: Herpesviral infection, unspecified: B00.9

## 2015-12-17 HISTORY — DX: Panic disorder (episodic paroxysmal anxiety): F41.0

## 2015-12-17 LAB — URINALYSIS, ROUTINE W REFLEX MICROSCOPIC
Bilirubin Urine: NEGATIVE
GLUCOSE, UA: NEGATIVE mg/dL
KETONES UR: NEGATIVE mg/dL
LEUKOCYTES UA: NEGATIVE
Nitrite: NEGATIVE
PROTEIN: NEGATIVE mg/dL
Specific Gravity, Urine: 1.015 (ref 1.005–1.030)
pH: 6 (ref 5.0–8.0)

## 2015-12-17 LAB — URINE MICROSCOPIC-ADD ON

## 2015-12-17 MED ORDER — CYCLOBENZAPRINE HCL 5 MG PO TABS: 5.0000 mg | ORAL_TABLET | Freq: Three times a day (TID) | ORAL | Status: DC | PRN

## 2015-12-17 NOTE — MAU Provider Note (Signed)
History     CSN: [id number]  Arrival date and time: 12/17/15 1241   First Provider Initiated Contact with Patient 12/17/15 1346      Chief Complaint  Patient presents with  . Abdominal Pain  . Pelvic Pain   HPI  Dawn Gray is a 40 y.o. female [phone] here with abdominal pain. The pain is located in the pelvis, right at the bottom of her stomach. The pain feels like "pressure". The pain started 3 days ago and has progressively gotten worse. The pain feels "heavier".  The pain worsens to walk or move. The pain feels somewhat better and lightens when she lays down. She has not tried anything for the pain. She does not wear a pregnancy support belt at this time.   Denies vaginal bleeding.    OB History    Gravida Para Term Preterm AB TAB SAB Ectopic Multiple Living   5 3 3  1  1   3       Past Medical History  Diagnosis Date  . Anemia   . Bronchitis     Hx of Chronic  . GERD (gastroesophageal reflux disease)   . Environmental allergies     seasonal  . Cat allergies   . Migraine   . 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 Herpes     + serum, no outbreaks  . Panic attack     Past Surgical History  Procedure Laterality Date  . Tumor removal  01/2009    From Lung and Heart    Family History  Problem Relation Age of Onset  . Hypertension Mother   . Diabetes Mother   . Arthritis Mother   . Diabetes Father   . Hypertension Father   . Hyperlipidemia Father   . Graves' disease Father   . Arthritis Paternal Grandmother   . Hyperlipidemia Paternal Grandmother   . Stroke Paternal Grandmother   . Lung cancer Paternal Grandmother     brain mets  . Hyperlipidemia Maternal Grandfather   . Cancer Maternal Grandfather     unsure what kind  . Hyperlipidemia Paternal Grandfather   . Heart disease Maternal Uncle   . Stroke Maternal Grandmother   . Diabetes Paternal Grandmother   . Colon cancer Neg Hx     Social History  Substance Use Topics  .  Smoking status: Never Smoker   . Smokeless tobacco: Never Used  . Alcohol Use: Yes     Comment: occasional    Allergies: No Known Allergies  Prescriptions prior to admission  Medication Sig Dispense Refill Last Dose  . flintstones complete (FLINTSTONES) 60 MG chewable tablet Chew 1 tablet by mouth 2 (two) times daily.   12/17/2015 at Unknown time  . albuterol (PROAIR HFA) 108 (90 BASE) MCG/ACT inhaler Inhale 2 puffs into the lungs every 6 (six) hours as needed for wheezing or shortness of breath. 1 Inhaler 5 rescue  . ALPRAZolam (XANAX) 0.25 MG tablet Take 1 tablet (0.25 mg total) by mouth at bedtime as needed for anxiety. (Patient not taking: Reported on 12/17/2015) 30 tablet 0 Taking  . Prenatal Vit-Fe Fum-Fe Bisg-FA (NATACHEW) 28-1 MG CHEW Chew 1 tablet by mouth daily. (Patient not taking: Reported on 12/17/2015) 30 tablet 6   . valACYclovir (VALTREX) 1000 MG tablet Take 1 tablet (1,000 mg total) by mouth daily. (Patient not taking: Reported on 12/17/2015) 30 tablet 11 Taking   Results for orders placed or performed during the hospital  encounter of 12/17/15 (from the past 72 hour(s))  Urinalysis, Routine w reflex microscopic (not at [facility])     Status: Abnormal   Collection Time: 12/17/15  1:00 PM  Result Value Ref Range   Color, Urine YELLOW YELLOW   APPearance CLEAR CLEAR   Specific Gravity, Urine 1.015 1.005 - 1.030   pH 6.0 5.0 - 8.0   Glucose, UA NEGATIVE NEGATIVE mg/dL   Hgb urine dipstick TRACE (A) NEGATIVE   Bilirubin Urine NEGATIVE NEGATIVE   Ketones, ur NEGATIVE NEGATIVE mg/dL   Protein, ur NEGATIVE NEGATIVE mg/dL   Nitrite NEGATIVE NEGATIVE   Leukocytes, UA NEGATIVE NEGATIVE  Urine microscopic-add on     Status: Abnormal   Collection Time: 12/17/15  1:00 PM  Result Value Ref Range   Squamous Epithelial / LPF 0-5 (A) NONE SEEN   WBC, UA 0-5 0 - 5 WBC/hpf   RBC / HPF 0-5 0 - 5 RBC/hpf   Bacteria, UA RARE (A) NONE SEEN    Review of Systems  Constitutional: Negative for fever  and chills.  Gastrointestinal: Negative for abdominal pain.  Genitourinary: Negative for dysuria, urgency and frequency.   Physical Exam   Blood pressure 107/66, pulse 82, temperature 97.9 F (36.6 C), temperature source Oral, resp. rate 16, weight 176 lb 3.2 oz (79.924 kg), last menstrual period 08/09/2015.  Physical Exam  Constitutional: She is oriented to person, place, and time. She appears well-developed and well-nourished. No distress.  HENT:  Head: Normocephalic.  Eyes: Pupils are equal, round, and reactive to light.  Neck: Neck supple.  GI: There is tenderness in the suprapubic area. There is no rigidity, no rebound and no guarding.  Genitourinary:  Dilation: Closed Exam by:: J Rasch NP  Musculoskeletal: Normal range of motion.  Neurological: She is alert and oriented to person, place, and time.  Skin: Skin is warm. She is not diaphoretic.  Psychiatric: Her behavior is normal.    MAU Course  Procedures  None  MDM  + fetal heart tones via doppler.   Assessment and Plan   A:  1. Pain in symphysis pubis during pregnancy   2. Round ligament pain     P:  Discharge home in stable condition  Pregnancy support belt recommended Change positions slowly Rx: Flexeril  Follow up with OB as scheduled Return to MAU if symptoms worsen  Lezlie Lye, NP 12/17/2015 8:53 PM

## 2015-12-17 NOTE — MAU Note (Signed)
Having pain in pelvis and lower abd.   Started 3 days ago, has gotten worse, is constant; though is worse when she stands or is walking  Is "light" when sitting.

## 2015-12-17 NOTE — Discharge Instructions (Signed)

## 2015-12-19 LAB — URINE CULTURE: Special Requests: NORMAL

## 2015-12-26 ENCOUNTER — Encounter: Payer: Managed Care, Other (non HMO) | Admitting: Certified Nurse Midwife

## 2015-12-26 ENCOUNTER — Encounter: Payer: Self-pay | Admitting: Advanced Practice Midwife

## 2015-12-26 ENCOUNTER — Ambulatory Visit (INDEPENDENT_AMBULATORY_CARE_PROVIDER_SITE_OTHER): Payer: Managed Care, Other (non HMO) | Admitting: Advanced Practice Midwife

## 2015-12-26 VITALS — BP 116/71 | HR 85 | Wt 177.0 lb

## 2015-12-26 DIAGNOSIS — M899 Disorder of bone, unspecified: Secondary | ICD-10-CM

## 2015-12-26 DIAGNOSIS — Z3482 Encounter for supervision of other normal pregnancy, second trimester: Secondary | ICD-10-CM | POA: Diagnosis not present

## 2015-12-26 NOTE — Patient Instructions (Signed)

## 2015-12-26 NOTE — Progress Notes (Signed)
Subjective:  Dawn Gray is a 40 y.o. [id number] at [redacted]w[redacted]d being seen today for ongoing prenatal care.  She is currently monitored for the following issues for this low-risk pregnancy and has Chronic constipation; Schwannoma; Panic attack; Gastroenteritis; Supervision of other normal pregnancy, antepartum; AMA (advanced maternal age) multigravida 33+; Herpes genitalia; and Pubic bone pain on her problem list.  Patient reports pubic bone pain frequently.  Contractions: Not present. Vag. Bleeding: None.  Movement: Present. Denies leaking of fluid.   The following portions of the patient's history were reviewed and updated as appropriate: allergies, current medications, past family history, past medical history, past social history, past surgical history and problem list. Problem list updated.  Objective:   Filed Vitals:   12/26/15 0848  BP: 116/71  Pulse: 85  Weight: 177 lb (80.287 kg)    Fetal Status:     Movement: Present     General:  Alert, oriented and cooperative. Patient is in no acute distress.  Skin: Skin is warm and dry. No rash noted.   Cardiovascular: Normal heart rate noted  Respiratory: Normal respiratory effort, no problems with respiration noted  Abdomen: Soft, gravid, appropriate for gestational age. Pain/Pressure: Present     Pelvic:  Cervical exam deferred        Extremities: Normal range of motion.  Edema: None  Mental Status: Normal mood and affect. Normal behavior. Normal judgment and thought content.   Urinalysis: Urine Protein: Negative Urine Glucose: Negative  Assessment and Plan:  Pregnancy: [id number] at [redacted]w[redacted]d  1. Supervision of other normal pregnancy, antepartum, second trimester      Reviewed normal anatomy scan      Wants to do waterbirth       Discussed needs eval tub needs, whether to use service or ours Went to class.  Needs to scan certificate.  2. Pubic bone pain      Discussed comfort measures. Encouraged to get support belt, Rx written for insurance,  Discussed water exercise.  PT might help.  Will refer and pt will check re: insurance coverage  Preterm labor symptoms and general obstetric precautions including but not limited to vaginal bleeding, contractions, leaking of fluid and fetal movement were reviewed in detail with the patient. Please refer to After Visit Summary for other counseling recommendations.  RTO 4 wks  Seabron Spates, CNM

## 2015-12-26 NOTE — Progress Notes (Signed)
Patient complaining of pelvic pain. Kathrene Alu RNBSN

## 2016-02-20 ENCOUNTER — Encounter: Payer: Managed Care, Other (non HMO) | Admitting: Family

## 2016-02-24 ENCOUNTER — Other Ambulatory Visit: Payer: Self-pay | Admitting: Family Medicine

## 2016-02-24 NOTE — Telephone Encounter (Signed)
Patient is pregnant, please advise if the refill is appropriate.     KP

## 2016-02-24 NOTE — Telephone Encounter (Signed)
She probably should not take it daily but her ob may want her to start taking it later in pregnancy to prevent a break out.  She should talk to her ob

## 2016-02-25 ENCOUNTER — Telehealth: Payer: Self-pay

## 2016-02-25 NOTE — Telephone Encounter (Signed)
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 Please advise if the refill is appropriate.  Please advise   KP

## 2016-02-25 NOTE — Telephone Encounter (Signed)
Refill x 1 year 

## 2016-02-26 MED ORDER — VALACYCLOVIR HCL 1 G PO TABS: 1000.0000 mg | ORAL_TABLET | Freq: Every day | ORAL | 11 refills | Status: DC

## 2016-02-26 NOTE — Telephone Encounter (Signed)
Medication filled to pharmacy as requested.  Pt notified. 

## 2016-02-26 NOTE — Telephone Encounter (Signed)
Not on current med list. Please advise dose and sig.

## 2016-02-26 NOTE — Telephone Encounter (Signed)
Valtrex 1000mg  #30  11 refills  1 po qd

## 2016-03-23 ENCOUNTER — Telehealth: Payer: Self-pay

## 2016-03-23 NOTE — Telephone Encounter (Signed)
Spoke with patient and she stated she has a skin tag in the inner thigh area and she would like to have it removed because it is irritated and hurts when she walks due to weight gain from pregnancy. The patient is [redacted] weeks pregnant and GYN advised to see PCP for skin tag removal. Can this be removed or should she wait?  Please advise     KP

## 2016-03-23 NOTE — Telephone Encounter (Signed)
It can be removed if she wants it but it is not threatening so can wait if she wants. I usually refer these to [facility]

## 2016-03-24 NOTE — Telephone Encounter (Signed)
Informed Dr. Nani Ravens of the note below and he stated that he can remove the skin tag, but most of the time insurance does not cover for it to be removed.  He stated to have the pt check with her insurance first to see if they will cover it.  Or she can pay out of pocket but don't know how much it would be.  Called and spoke with the pt and informed her of Dr. Nani Ravens message.  Pt agreed to check with her insurance.  She stated that if they don't pay for it she will do something else.  Asked the pt to please give me a call back and let me know.  She agreed.//AB/CMA

## 2016-03-24 NOTE — Telephone Encounter (Signed)
Patient wants to have it removed ASAP. Please see if Dr.Wendling  would do it.   KP

## 2016-04-05 NOTE — Telephone Encounter (Signed)
Called and spoke with the pt on (Friday-04/02/16) to follow-up on the skin tag.  Asked the pt if she was able to contact her insurance company regarding if they will cover the remover.  Pt stated that she has been very busy and have not had the time.  Pt stated that she did a home remedies and the skin tag came off.  She said that everything looks good, and she has been keeping a watch on it to make sure there is no signs of infection.  Asked the pt to please give [place] a call if she notices any signs of infection.  Pt verbalized understanding and agreed.//AB/CMA

## 2016-07-16 ENCOUNTER — Encounter: Payer: Self-pay | Admitting: Family Medicine

## 2016-07-16 ENCOUNTER — Ambulatory Visit (INDEPENDENT_AMBULATORY_CARE_PROVIDER_SITE_OTHER): Payer: Managed Care, Other (non HMO) | Admitting: Family Medicine

## 2016-07-16 DIAGNOSIS — F419 Anxiety disorder, unspecified: Secondary | ICD-10-CM

## 2016-07-16 MED ORDER — ESCITALOPRAM OXALATE 10 MG PO TABS: 10.0000 mg | ORAL_TABLET | Freq: Every day | ORAL | 1 refills | Status: DC

## 2016-07-16 NOTE — Progress Notes (Signed)
Pre visit review using our clinic review tool, if applicable. No additional management support is needed unless otherwise documented below in the visit note. 

## 2016-07-16 NOTE — Progress Notes (Signed)
Subjective:  I acted as a Education administrator for Dr. Carollee Herter.  Dawn Gray, [place]   Patient ID: Dawn Gray, female    DOB: 07-Oct-1975, 41 y.o.   MRN: [id number]  Chief Complaint  Patient presents with  . FMLA paperwork    anxiety and panic attacks at work    HPI Patient is in today for [facility] paperwork.  She has been having anxiety and panic attacks at work.  She is having anxiety attacks about q47m x 2 days.    She had a baby 9 weeks ago.  Her fmla for baby ran out and now she needs new fmla papers.  No new complaints.   Past Medical History:  Diagnosis Date  . Anemia   . Bronchitis    Hx of Chronic  . Cat allergies   . Environmental allergies    seasonal  . GERD (gastroesophageal reflux disease)   . 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 Migraine   . Panic attack     Past Surgical History:  Procedure Laterality Date  . TUMOR REMOVAL  01/2009   From Lung and Heart    Family History  Problem Relation Age of Onset  . Hypertension Mother   . Diabetes Mother   . Arthritis Mother   . Diabetes Father   . Hypertension Father   . Hyperlipidemia Father   . Graves' disease Father   . Arthritis Paternal Grandmother   . Hyperlipidemia Paternal Grandmother   . Stroke Paternal Grandmother   . Lung cancer Paternal Grandmother     brain mets  . Hyperlipidemia Maternal Grandfather   . Cancer Maternal Grandfather     unsure what kind  . Hyperlipidemia Paternal Grandfather   . Heart disease Maternal Uncle   . Stroke Maternal Grandmother   . Diabetes Paternal Grandmother   . Colon cancer Neg Hx     Social History   Social History  . Marital status: Single    Spouse name: N/A  . Number of children: N/A  . Years of education: N/A   Occupational History  . customer service    Social History Main Topics  . Smoking status: Never Smoker  . Smokeless tobacco: Never Used  . Alcohol use Yes     Comment: occasional  . Drug use:  No  . Sexual activity: Yes    Partners: Male   Other Topics Concern  . Not on file   Social History Narrative  . No narrative on file    Outpatient Medications Prior to Visit  Medication Sig Dispense Refill  . flintstones complete (FLINTSTONES) 60 MG chewable tablet Chew 1 tablet by mouth 2 (two) times daily.    . cyclobenzaprine (FLEXERIL) 5 MG tablet Take 1 tablet (5 mg total) by mouth 3 (three) times daily as needed for muscle spasms. (Patient not taking: Reported on 12/26/2015) 10 tablet 0  . valACYclovir (VALTREX) 1000 MG tablet Take 1 tablet (1,000 mg total) by mouth daily. 30 tablet 11   No facility-administered medications prior to visit.     No Known Allergies  Review of Systems  Constitutional: Negative for fever and malaise/fatigue.  HENT: Negative for congestion.   Eyes: Negative for blurred vision.  Respiratory: Negative for cough and shortness of breath.   Cardiovascular: Negative for chest pain, palpitations and leg swelling.  Gastrointestinal: Negative for vomiting.  Musculoskeletal: Negative for back pain.  Skin: Negative  for rash.  Neurological: Negative for loss of consciousness and headaches.       Objective:    Physical Exam  Constitutional: She is oriented to person, place, and time. She appears well-developed and well-nourished. No distress.  HENT:  Head: Normocephalic and atraumatic.  Eyes: Conjunctivae are normal.  Neck: Normal range of motion. No thyromegaly present.  Cardiovascular: Normal rate and regular rhythm.   Pulmonary/Chest: Effort normal and breath sounds normal. She has no wheezes.  Abdominal: Soft. Bowel sounds are normal. There is no tenderness.  Musculoskeletal: Normal range of motion. She exhibits no edema or deformity.  Neurological: She is alert and oriented to person, place, and time.  Skin: Skin is warm and dry. She is not diaphoretic.  Psychiatric: She has a normal mood and affect.    BP 116/70 (BP Location: Right Arm,  Cuff Size: Normal)   Pulse 90   Temp 98.2 F (36.8 C) (Oral)   Resp 16   Ht 5' 3.7" (1.618 m)   Wt 158 lb 6.4 oz (71.8 kg)   LMP 08/09/2015   SpO2 97%   Breastfeeding? Yes   BMI 27.45 kg/m  Wt Readings from Last 3 Encounters:  07/16/16 158 lb 6.4 oz (71.8 kg)  12/26/15 177 lb (80.3 kg)  12/17/15 176 lb 3.2 oz (79.9 kg)     Lab Results  Component Value Date   WBC 9.2 10/07/2015   HGB 11.1 (L) 10/07/2015   HCT 34.8 (L) 10/07/2015   PLT 263 10/07/2015   GLUCOSE 70 05/14/2014   CHOL 206 (H) 05/14/2014   TRIG 68.0 05/14/2014   HDL 46.00 05/14/2014   LDLCALC 146 (H) 05/14/2014   ALT 13 05/14/2014   AST 19 05/14/2014   NA 135 05/14/2014   K 3.4 (L) 05/14/2014   CL 104 05/14/2014   CREATININE 0.8 05/14/2014   BUN 15 05/14/2014   CO2 23 05/14/2014   TSH 1.55 05/14/2014   INR 0.93 06/24/2011    Lab Results  Component Value Date   TSH 1.55 05/14/2014   Lab Results  Component Value Date   WBC 9.2 10/07/2015   HGB 11.1 (L) 10/07/2015   HCT 34.8 (L) 10/07/2015   MCV 79.3 (L) 10/07/2015   PLT 263 10/07/2015   Lab Results  Component Value Date   NA 135 05/14/2014   K 3.4 (L) 05/14/2014   CO2 23 05/14/2014   GLUCOSE 70 05/14/2014   BUN 15 05/14/2014   CREATININE 0.8 05/14/2014   BILITOT 1.1 05/14/2014   ALKPHOS 58 05/14/2014   AST 19 05/14/2014   ALT 13 05/14/2014   PROT 8.3 05/14/2014   ALBUMIN 4.6 05/14/2014   CALCIUM 9.3 05/14/2014   GFR 98.50 05/14/2014   Lab Results  Component Value Date   CHOL 206 (H) 05/14/2014   Lab Results  Component Value Date   HDL 46.00 05/14/2014   Lab Results  Component Value Date   LDLCALC 146 (H) 05/14/2014   Lab Results  Component Value Date   TRIG 68.0 05/14/2014   Lab Results  Component Value Date   CHOLHDL 4 05/14/2014   No results found for: HGBA1C     Assessment & Plan:   Problem List Items Addressed This Visit      Unprioritized   Anxiety    Unable to use xanax due to breast feeding lexapro 10  mg qd Pt will f/u gyn for IUD rto 1 month or sooner prn  Pt also given name and number of  counselors      Relevant Medications   escitalopram (LEXAPRO) 10 MG tablet      I have discontinued Dawn Gray's cyclobenzaprine and valACYclovir. I am also having her start on escitalopram. Additionally, I am having her maintain her flintstones complete.  Meds ordered this encounter  Medications  . escitalopram (LEXAPRO) 10 MG tablet    Sig: Take 1 tablet (10 mg total) by mouth daily.    Dispense:  90 tablet    Refill:  1    CMA served as Education administrator during this visit. History, Physical and Plan performed by medical provider. Documentation and orders reviewed and attested to.  Ann Held, DO

## 2016-07-16 NOTE — Assessment & Plan Note (Signed)
Unable to use xanax due to breast feeding lexapro 10 mg qd Pt will f/u gyn for IUD rto 1 month or sooner prn  Pt also given name and number of counselors

## 2016-07-16 NOTE — Patient Instructions (Signed)
Generalized Anxiety Disorder Generalized anxiety disorder (GAD) is a mental disorder. It interferes with life functions, including relationships, work, and school. GAD is different from normal anxiety, which everyone experiences at some point in their lives in response to specific life events and activities. Normal anxiety actually helps us prepare for and get through these life events and activities. Normal anxiety goes away after the event or activity is over.  GAD causes anxiety that is not necessarily related to specific events or activities. It also causes excess anxiety in proportion to specific events or activities. The anxiety associated with GAD is also difficult to control. GAD can vary from mild to severe. People with severe GAD can have intense waves of anxiety with physical symptoms (panic attacks).  SYMPTOMS The anxiety and worry associated with GAD are difficult to control. This anxiety and worry are related to many life events and activities and also occur more days than not for 6 months or longer. People with GAD also have three or more of the following symptoms (one or more in children):  Restlessness.   Fatigue.  Difficulty concentrating.   Irritability.  Muscle tension.  Difficulty sleeping or unsatisfying sleep. DIAGNOSIS GAD is diagnosed through an assessment by your health care provider. Your health care provider will ask you questions aboutyour mood,physical symptoms, and events in your life. Your health care provider may ask you about your medical history and use of alcohol or drugs, including prescription medicines. Your health care provider may also do a physical exam and blood tests. Certain medical conditions and the use of certain substances can cause symptoms similar to those associated with GAD. Your health care provider may refer you to a mental health specialist for further evaluation. TREATMENT The following therapies are usually used to treat GAD:    Medication. Antidepressant medication usually is prescribed for long-term daily control. Antianxiety medicines may be added in severe cases, especially when panic attacks occur.   Talk therapy (psychotherapy). Certain types of talk therapy can be helpful in treating GAD by providing support, education, and guidance. A form of talk therapy called cognitive behavioral therapy can teach you healthy ways to think about and react to daily life events and activities.  Stress managementtechniques. These include yoga, meditation, and exercise and can be very helpful when they are practiced regularly. A mental health specialist can help determine which treatment is best for you. Some people see improvement with one therapy. However, other people require a combination of therapies. This information is not intended to replace advice given to you by your health care provider. Make sure you discuss any questions you have with your health care provider. Document Released: 09/25/2012 Document Revised: 06/21/2014 Document Reviewed: 09/25/2012 Elsevier Interactive Patient Education  2017 [employer]  

## 2016-12-28 ENCOUNTER — Encounter: Payer: Self-pay | Admitting: Family Medicine

## 2016-12-28 ENCOUNTER — Ambulatory Visit (INDEPENDENT_AMBULATORY_CARE_PROVIDER_SITE_OTHER): Payer: Managed Care, Other (non HMO) | Admitting: Family Medicine

## 2016-12-28 ENCOUNTER — Telehealth: Payer: Self-pay | Admitting: *Deleted

## 2016-12-28 VITALS — BP 110/62 | HR 79 | Temp 97.9°F | Ht 63.5 in | Wt 162.2 lb

## 2016-12-28 DIAGNOSIS — F419 Anxiety disorder, unspecified: Secondary | ICD-10-CM

## 2016-12-28 DIAGNOSIS — F329 Major depressive disorder, single episode, unspecified: Secondary | ICD-10-CM

## 2016-12-28 DIAGNOSIS — Z3009 Encounter for other general counseling and advice on contraception: Secondary | ICD-10-CM

## 2016-12-28 DIAGNOSIS — F32A Depression, unspecified: Secondary | ICD-10-CM

## 2016-12-28 MED ORDER — SERTRALINE HCL 50 MG PO TABS: ORAL_TABLET | ORAL | 2 refills | Status: DC

## 2016-12-28 MED ORDER — MISOPROSTOL 200 MCG PO TABS: ORAL_TABLET | ORAL | 0 refills | Status: DC

## 2016-12-28 NOTE — Telephone Encounter (Signed)
Pt called to schedule a Paragard insertion.  Per protocol Cytotec 400 mg sent to her pharmacy  To take PO the night prior to surgery.

## 2016-12-28 NOTE — Patient Instructions (Signed)
Panic Attacks Panic attacks are sudden, short feelings of great fear or discomfort. You may have them for no reason when you are relaxed, when you are uneasy (anxious), or when you are sleeping. Follow these instructions at home:  Take all your medicines as told.  Check with your doctor before starting new medicines.  Keep all doctor visits. Contact a doctor if:  You are not able to take your medicines as told.  Your symptoms do not get better.  Your symptoms get worse. Get help right away if:  Your attacks seem different than your normal attacks.  You have thoughts about hurting yourself or others.  You take panic attack medicine and you have a side effect. This information is not intended to replace advice given to you by your health care provider. Make sure you discuss any questions you have with your health care provider. Document Released: 07/03/2010 Document Revised: 11/06/2015 Document Reviewed: 01/12/2013 Elsevier Interactive Patient Education  2017 Elsevier Inc.  

## 2016-12-28 NOTE — Progress Notes (Signed)
Pre visit review using our clinic review tool, if applicable. No additional management support is needed unless otherwise documented below in the visit note. 

## 2016-12-28 NOTE — Progress Notes (Signed)
Patient ID: Dawn Gray, female    DOB: [birth year]  Age: 41 y.o. MRN: [id number]    Subjective:  Subjective  HPI Dawn Gray presents for f/u anxiety,/ depression.  She feels like it is getting worse.  She is still breast feeding.  The baby is 88 mon old.  Her marriage is not good.but they are considering marriage counseling.  Pt need fmla paperwork filled out.    Review of Systems  Constitutional: Negative for activity change, appetite change, fatigue and unexpected weight change.  Respiratory: Negative for cough and shortness of breath.   Cardiovascular: Negative for chest pain and palpitations.  Psychiatric/Behavioral: Positive for dysphoric mood. Negative for behavioral problems, self-injury, sleep disturbance and suicidal ideas. The patient is nervous/anxious.     History Past Medical History:  Diagnosis Date  . Anemia   . Bronchitis    Hx of Chronic  . Cat allergies   . Environmental allergies    seasonal  . GERD (gastroesophageal reflux disease)   . 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 Migraine   . Panic attack     She has a past surgical history that includes Tumor removal (01/2009).   Her family history includes Arthritis in her mother and paternal grandmother; Cancer in her maternal grandfather; Diabetes in her father, mother, and paternal grandmother; Dawn Gray' disease in her father; Heart disease in her maternal uncle; Hyperlipidemia in her father, maternal grandfather, paternal grandfather, and paternal grandmother; Hypertension in her father and mother; Lung cancer in her paternal grandmother; Stroke in her maternal grandmother and paternal grandmother.She reports that she has never smoked. She has never used smokeless tobacco. She reports that she drinks alcohol. She reports that she does not use drugs.  Current Outpatient Prescriptions on File Prior to Visit  Medication Sig Dispense Refill  . flintstones  complete (FLINTSTONES) 60 MG chewable tablet Chew 1 tablet by mouth 2 (two) times daily.     No current facility-administered medications on file prior to visit.      Objective:  Objective  Physical Exam  Constitutional: She is oriented to person, place, and time. She appears well-developed and well-nourished.  HENT:  Head: Normocephalic and atraumatic.  Eyes: Conjunctivae and EOM are normal.  Neck: Normal range of motion. Neck supple. No JVD present. Carotid bruit is not present. No thyromegaly present.  Cardiovascular: Normal rate, regular rhythm and normal heart sounds.   No murmur heard. Pulmonary/Chest: Effort normal and breath sounds normal. No respiratory distress. She has no wheezes. She has no rales. She exhibits no tenderness.  Musculoskeletal: She exhibits no edema.  Neurological: She is alert and oriented to person, place, and time.  Psychiatric: Her behavior is normal. Judgment and thought content normal. Her mood appears anxious. Her affect is not angry, not blunt and not labile. Cognition and memory are normal. She exhibits a depressed mood.  Nursing note and vitals reviewed.  BP 110/62 (BP Location: Left Arm, Patient Position: Sitting, Cuff Size: Normal)   Pulse 79   Temp 97.9 F (36.6 C) (Oral)   Ht 5' 3.5" (1.613 m)   Wt 162 lb 4 oz (73.6 kg)   SpO2 96%   BMI 28.29 kg/m  Wt Readings from Last 3 Encounters:  12/28/16 162 lb 4 oz (73.6 kg)  07/16/16 158 lb 6.4 oz (71.8 kg)  12/26/15 177 lb (80.3 kg)     Lab Results  Component  Value Date   WBC 9.2 10/07/2015   HGB 11.1 (L) 10/07/2015   HCT 34.8 (L) 10/07/2015   PLT 263 10/07/2015   GLUCOSE 70 05/14/2014   CHOL 206 (H) 05/14/2014   TRIG 68.0 05/14/2014   HDL 46.00 05/14/2014   LDLCALC 146 (H) 05/14/2014   ALT 13 05/14/2014   AST 19 05/14/2014   NA 135 05/14/2014   K 3.4 (L) 05/14/2014   CL 104 05/14/2014   CREATININE 0.8 05/14/2014   BUN 15 05/14/2014   CO2 23 05/14/2014   TSH 1.55 05/14/2014    INR 0.93 06/24/2011    No results found.   Assessment & Plan:  Plan  I have discontinued Ms. Pascal's escitalopram and misoprostol. I am also having her start on sertraline. Additionally, I am having her maintain her flintstones complete.  Meds ordered this encounter  Medications  . sertraline (ZOLOFT) 50 MG tablet    Sig: 1/2 tab po qd for 1 week then increase to 1 po qd    Dispense:  30 tablet    Refill:  2    Problem List Items Addressed This Visit    None    Visit Diagnoses    Anxiety and depression    -  Primary   Relevant Medications   sertraline (ZOLOFT) 50 MG tablet   Other Relevant Orders   Ambulatory referral to Psychology    fmla paperwork filled out and scanned in  Follow-up: Return in about 4 weeks (around 01/25/2017), or if symptoms worsen or fail to improve.  Ann Held, DO

## 2017-01-03 ENCOUNTER — Telehealth: Payer: Self-pay | Admitting: *Deleted

## 2017-01-03 NOTE — Telephone Encounter (Signed)
Received FMLA paperwork from provider's outgoing folder; patient brought at 12/28/16 OV. Faxed completed paperwork to [employer] at [phone] for Claim Number: [id number] IFN/SLS 07/23

## 2017-01-13 ENCOUNTER — Encounter: Payer: Self-pay | Admitting: *Deleted

## 2017-01-13 ENCOUNTER — Other Ambulatory Visit (HOSPITAL_COMMUNITY)
Admission: RE | Admit: 2017-01-13 | Payer: Managed Care, Other (non HMO) | Source: Ambulatory Visit | Admitting: Obstetrics & Gynecology

## 2017-01-13 ENCOUNTER — Encounter: Payer: Self-pay | Admitting: Obstetrics & Gynecology

## 2017-01-13 ENCOUNTER — Ambulatory Visit (INDEPENDENT_AMBULATORY_CARE_PROVIDER_SITE_OTHER): Payer: Managed Care, Other (non HMO) | Admitting: Obstetrics & Gynecology

## 2017-01-13 VITALS — BP 111/74 | HR 80 | Resp 16 | Ht 64.0 in | Wt 164.0 lb

## 2017-01-13 DIAGNOSIS — Z30430 Encounter for insertion of intrauterine contraceptive device: Secondary | ICD-10-CM

## 2017-01-13 DIAGNOSIS — Z113 Encounter for screening for infections with a predominantly sexual mode of transmission: Secondary | ICD-10-CM | POA: Diagnosis not present

## 2017-01-13 DIAGNOSIS — Z3202 Encounter for pregnancy test, result negative: Secondary | ICD-10-CM | POA: Diagnosis not present

## 2017-01-13 DIAGNOSIS — Z131 Encounter for screening for diabetes mellitus: Secondary | ICD-10-CM

## 2017-01-13 DIAGNOSIS — Z30019 Encounter for initial prescription of contraceptives, unspecified: Secondary | ICD-10-CM

## 2017-01-13 LAB — POCT URINE PREGNANCY: PREG TEST UR: NEGATIVE

## 2017-01-13 MED ORDER — PARAGARD INTRAUTERINE COPPER IU IUD: INTRAUTERINE_SYSTEM | Freq: Once | INTRAUTERINE | Status: AC

## 2017-01-14 LAB — HEMOGLOBIN A1C
Hgb A1c MFr Bld: 5.4 % (ref <5.7)
Mean Plasma Glucose: 108 mg/dL

## 2017-01-16 NOTE — Progress Notes (Signed)
   Subjective:    Patient ID: Dawn Gray, female    DOB: 26-May-1976, 41 y.o.   MRN: [id number]  HPI  Pt here wanting IUD.  Pt was our prenatal patient but wanted a water birth.  [facility] [place].  She was not aware that [facility] did water births.  Pt developed A2 diabetes and did not have a water birth after all.  She did not take her post partum 2 hr glucola.  She refuses this test today but agrees to hgb A1C.  Pt has had Paragard in the past.  She did well with it.   Review of Systems  Constitutional: Negative.   Cardiovascular: Negative.   Gastrointestinal: Negative.   Genitourinary: Negative.        Objective:   Physical Exam  Constitutional: She is oriented to person, place, and time. She appears well-developed and well-nourished. No distress.  HENT:  Head: Normocephalic and atraumatic.  Eyes: Conjunctivae are normal.  Pulmonary/Chest: Effort normal.  Abdominal: Soft. Bowel sounds are normal. She exhibits no distension. There is no tenderness.  Musculoskeletal: She exhibits no edema.  Neurological: She is alert and oriented to person, place, and time.  Skin: Skin is warm and dry.  Psychiatric: She has a normal mood and affect.  Vitals reviewed.  Vitals:   01/13/17 1549  BP: 111/74  Pulse: 80  Resp: 16  Weight: 164 lb (74.4 kg)  Height: 5\' 4"  (1.626 m)   Assessment & Plan:   41 yo female need diabetes screen and IUD insertion.  Has not had unprotected intercourse for the last 2 weeks and UPT is negative.    1.  Hgb A1C  IUD Procedure Note Patient identified, informed consent performed.  Discussed risks of irregular bleeding, cramping, infection, malpositioning or misplacement of the IUD outside the uterus which may require further procedures. Time out was performed.  Urine pregnancy test negative.  Speculum placed in the vagina.  Cervix visualized.  Cleaned with Betadine x 2.  Grasped anteriorly with a single tooth tenaculum.  Uterus  sounded to 8 cm.  Paragard IUD placed per manufacturer's recommendations.  Strings trimmed to 3 cm. Tenaculum was removed, good hemostasis noted.  Patient tolerated procedure well.   Patient was given post-procedure instructions and the Paragard care card with expiration date.  Patient was also asked to check IUD strings periodically and follow up in 4-6 weeks for IUD check.

## 2017-01-17 LAB — CERVICOVAGINAL ANCILLARY ONLY
Chlamydia: NEGATIVE
NEISSERIA GONORRHEA: NEGATIVE

## 2017-04-08 ENCOUNTER — Ambulatory Visit: Payer: Managed Care, Other (non HMO) | Admitting: Family Medicine

## 2017-04-08 DIAGNOSIS — Z0289 Encounter for other administrative examinations: Secondary | ICD-10-CM

## 2017-04-11 ENCOUNTER — Telehealth: Payer: Self-pay | Admitting: *Deleted

## 2017-04-11 NOTE — Telephone Encounter (Signed)
Patient's paperwork was found on my laptop this morning with note that it was needed by end of day. Standard operating is 5-7 business days; called and informed patient, understood & agreed and will contact her disability representative to receive extension/SLS 10/29

## 2017-04-14 NOTE — Telephone Encounter (Signed)
FMLA paperwork is for leave beginning 03/29/17; patient's last OV was on 12/22/16 and she No Show on 04/08/17 appointment, but still dropped paperwork [see previous note]. I do not have EMR information to complete this paperwork, so I am forwarding back to Provider for completion/SLS 11/01

## 2017-04-18 NOTE — Telephone Encounter (Signed)
PCP [Dr Lowne-Chase] returned [employer] disability paperwork blank with note attached that patient needs OV to complete forms; please contact patient and have her schedule appointment per provider. Thanks/SLS 11/05

## 2017-04-18 NOTE — Telephone Encounter (Signed)
30-minutes for Dr. Etter Sjogren to complete paperwork. Thanks/SLS 11/05

## 2017-04-18 NOTE — Telephone Encounter (Signed)
Patient informed of message below and will check with employment regarding extension and will call back to schedule appointment, please advise if appointment needs to be 30 minutes.

## 2017-04-26 ENCOUNTER — Encounter: Payer: Self-pay | Admitting: Family Medicine

## 2017-04-26 ENCOUNTER — Ambulatory Visit (INDEPENDENT_AMBULATORY_CARE_PROVIDER_SITE_OTHER): Payer: Managed Care, Other (non HMO) | Admitting: Family Medicine

## 2017-04-26 VITALS — BP 123/76 | HR 66 | Temp 98.1°F | Resp 16 | Ht 64.0 in | Wt 166.6 lb

## 2017-04-26 DIAGNOSIS — F418 Other specified anxiety disorders: Secondary | ICD-10-CM | POA: Insufficient documentation

## 2017-04-26 DIAGNOSIS — G44221 Chronic tension-type headache, intractable: Secondary | ICD-10-CM | POA: Diagnosis not present

## 2017-04-26 DIAGNOSIS — F321 Major depressive disorder, single episode, moderate: Secondary | ICD-10-CM | POA: Diagnosis not present

## 2017-04-26 DIAGNOSIS — R519 Headache, unspecified: Secondary | ICD-10-CM | POA: Insufficient documentation

## 2017-04-26 DIAGNOSIS — R51 Headache: Secondary | ICD-10-CM

## 2017-04-26 MED ORDER — SERTRALINE HCL 100 MG PO TABS: 100.0000 mg | ORAL_TABLET | Freq: Every day | ORAL | 1 refills | Status: DC

## 2017-04-26 NOTE — Progress Notes (Signed)
Patient ID: Dawn Gray, female    DOB: 02/19/1976  Age: 41 y.o. MRN: [id number]    Subjective:  Subjective  HPI Dawn Gray presents for fmla paperwork for anxiety and worsening panic attacks.  She has an appointment at work with counselor  She is also getting headaches 3 days a weeks-- they last 3 hours each time.  Light bothers her more than nose.  She has not tried anything for headaches      Review of Systems  Constitutional: Negative for appetite change, diaphoresis, fatigue and unexpected weight change.  Eyes: Negative for pain, redness and visual disturbance.  Respiratory: Negative for cough, chest tightness, shortness of breath and wheezing.   Cardiovascular: Negative for chest pain, palpitations and leg swelling.  Endocrine: Negative for cold intolerance, heat intolerance, polydipsia, polyphagia and polyuria.  Genitourinary: Negative for difficulty urinating, dysuria and frequency.  Neurological: Positive for headaches. Negative for dizziness, light-headedness and numbness.  Psychiatric/Behavioral: Negative for dysphoric mood, self-injury, sleep disturbance and suicidal ideas. The patient is nervous/anxious.     History Past Medical History:  Diagnosis Date  . Anemia   . Bronchitis    Hx of Chronic  . Cat allergies   . Environmental allergies    seasonal  . GERD (gastroesophageal reflux disease)   . 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 Migraine   . Panic attack     She has a past surgical history that includes Tumor removal (01/2009).   Her family history includes Arthritis in her mother and paternal grandmother; Cancer in her maternal grandfather; Diabetes in her father, mother, and paternal grandmother; Berenice Primas' disease in her father; Heart disease in her maternal uncle; Hyperlipidemia in her father, maternal grandfather, paternal grandfather, and paternal grandmother; Hypertension in her father and mother;  Lung cancer in her paternal grandmother; Stroke in her maternal grandmother and paternal grandmother.She reports that  has never smoked. she has never used smokeless tobacco. She reports that she drinks alcohol. She reports that she does not use drugs.  Current Outpatient Medications on File Prior to Visit  Medication Sig Dispense Refill  . flintstones complete (FLINTSTONES) 60 MG chewable tablet Chew 1 tablet by mouth 2 (two) times daily.     Current Facility-Administered Medications on File Prior to Visit  Medication Dose Route Frequency Provider Last Rate Last Dose  . PARAGARD INTRAUTERINE COPPER IUD   Intrauterine Once Guss Bunde, MD         Objective:  Objective  Physical Exam  Constitutional: She is oriented to person, place, and time. She appears well-developed and well-nourished.  HENT:  Head: Normocephalic and atraumatic.  Eyes: Conjunctivae and EOM are normal.  Neck: Normal range of motion. Neck supple. No JVD present. Carotid bruit is not present. No thyromegaly present.  Cardiovascular: Normal rate, regular rhythm and normal heart sounds.  No murmur heard. Pulmonary/Chest: Effort normal and breath sounds normal. No respiratory distress. She has no wheezes. She has no rales. She exhibits no tenderness.  Musculoskeletal: She exhibits no edema.  Neurological: She is alert and oriented to person, place, and time.  Psychiatric: She has a normal mood and affect.  Nursing note and vitals reviewed.  BP 123/76 (BP Location: Left Arm, Cuff Size: Normal)   Pulse 66   Temp 98.1 F (36.7 C) (Oral)   Resp 16   Ht 5\' 4"  (1.626 m)   Wt 166 lb 9.6  oz (75.6 kg)   SpO2 98%   BMI 28.60 kg/m  Wt Readings from Last 3 Encounters:  04/26/17 166 lb 9.6 oz (75.6 kg)  01/13/17 164 lb (74.4 kg)  12/28/16 162 lb 4 oz (73.6 kg)     Lab Results  Component Value Date   WBC 9.2 10/07/2015   HGB 11.1 (L) 10/07/2015   HCT 34.8 (L) 10/07/2015   PLT 263 10/07/2015   GLUCOSE 70  05/14/2014   CHOL 206 (H) 05/14/2014   TRIG 68.0 05/14/2014   HDL 46.00 05/14/2014   LDLCALC 146 (H) 05/14/2014   ALT 13 05/14/2014   AST 19 05/14/2014   NA 135 05/14/2014   K 3.4 (L) 05/14/2014   CL 104 05/14/2014   CREATININE 0.8 05/14/2014   BUN 15 05/14/2014   CO2 23 05/14/2014   TSH 1.55 05/14/2014   INR 0.93 06/24/2011   HGBA1C 5.4 01/13/2017    No results found.   Assessment & Plan:  Plan  I have discontinued Dawn Gray's sertraline. I am also having her start on sertraline. Additionally, I am having her maintain her flintstones complete. We will continue to administer [place].  Meds ordered this encounter  Medications  . sertraline (ZOLOFT) 100 MG tablet    Sig: Take 1 tablet (100 mg total) daily by mouth.    Dispense:  90 tablet    Refill:  1    Problem List Items Addressed This Visit      Unprioritized   Depression, major, single episode, moderate ([place]) - Primary    Worsening F/u counselor [employer] zoloft 100 mg daily  F/u 1 month or sooner prn  fmla filled out      Relevant Medications   sertraline (ZOLOFT) 100 MG tablet   Headache    Pt is still breast feeding--  Baby turns 1 tomorrow and she will wean off Heat / ice Hot shower/ bath Tylenol rto prn  fmla filled out      Relevant Medications   sertraline (ZOLOFT) 100 MG tablet      Follow-up: Return in about 4 weeks (around 05/24/2017) for anxiety.  Ann Held, DO

## 2017-04-26 NOTE — Assessment & Plan Note (Signed)
Worsening F/u counselor [employer] zoloft 100 mg daily  F/u 1 month or sooner prn  fmla filled out

## 2017-04-26 NOTE — Assessment & Plan Note (Addendum)
Pt is still breast feeding--  Baby turns 1 tomorrow and she will wean off Heat / ice Hot shower/ bath Tylenol rto prn  fmla filled out

## 2017-04-26 NOTE — Patient Instructions (Signed)
Panic Attacks Panic attacks are sudden, short feelings of great fear or discomfort. You may have them for no reason when you are relaxed, when you are uneasy (anxious), or when you are sleeping. Follow these instructions at home:  Take all your medicines as told.  Check with your doctor before starting new medicines.  Keep all doctor visits. Contact a doctor if:  You are not able to take your medicines as told.  Your symptoms do not get better.  Your symptoms get worse. Get help right away if:  Your attacks seem different than your normal attacks.  You have thoughts about hurting yourself or others.  You take panic attack medicine and you have a side effect. This information is not intended to replace advice given to you by your health care provider. Make sure you discuss any questions you have with your health care provider. Document Released: 07/03/2010 Document Revised: 11/06/2015 Document Reviewed: 01/12/2013 Elsevier Interactive Patient Education  2017 Elsevier Inc.  

## 2017-05-16 ENCOUNTER — Ambulatory Visit (INDEPENDENT_AMBULATORY_CARE_PROVIDER_SITE_OTHER): Payer: Managed Care, Other (non HMO) | Admitting: Family

## 2017-05-16 ENCOUNTER — Encounter: Payer: Self-pay | Admitting: Family

## 2017-05-16 VITALS — BP 121/79 | HR 98 | Temp 98.2°F | Resp 16 | Ht 64.0 in | Wt 161.0 lb

## 2017-05-16 DIAGNOSIS — F418 Other specified anxiety disorders: Secondary | ICD-10-CM

## 2017-05-16 MED ORDER — VENLAFAXINE HCL ER 37.5 MG PO CP24: 37.5000 mg | ORAL_CAPSULE | Freq: Every day | ORAL | 0 refills | Status: DC
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
## 2017-05-16 NOTE — Patient Instructions (Signed)
Please begin effexor once daily in addition to the zoloft. Follow up with Dr. Etter Sjogren- Chase in 2 weeks.

## 2017-05-18 ENCOUNTER — Telehealth: Payer: Self-pay | Admitting: *Deleted

## 2017-05-18 NOTE — Telephone Encounter (Signed)
Received FMLA/Concurrent Disability and Leave paperwork from [employer]; completed as much as possible; forwarded to provider/SLS 12/05

## 2017-05-20 ENCOUNTER — Ambulatory Visit (INDEPENDENT_AMBULATORY_CARE_PROVIDER_SITE_OTHER): Payer: 59 | Admitting: Psychology

## 2017-05-20 DIAGNOSIS — F4323 Adjustment disorder with mixed anxiety and depressed mood: Secondary | ICD-10-CM | POA: Diagnosis not present

## 2017-05-27 NOTE — Telephone Encounter (Signed)
FMLA completed and signed by Debbrah Alar- and faxed to [phone].

## 2017-05-30 ENCOUNTER — Ambulatory Visit (INDEPENDENT_AMBULATORY_CARE_PROVIDER_SITE_OTHER): Payer: Managed Care, Other (non HMO) | Admitting: Family Medicine

## 2017-05-30 ENCOUNTER — Encounter: Payer: Self-pay | Admitting: Family Medicine

## 2017-05-30 ENCOUNTER — Telehealth: Payer: Self-pay | Admitting: Family Medicine

## 2017-05-30 VITALS — BP 110/70 | HR 82 | Temp 98.1°F | Ht 64.0 in | Wt 166.0 lb

## 2017-05-30 DIAGNOSIS — F419 Anxiety disorder, unspecified: Secondary | ICD-10-CM

## 2017-05-30 MED ORDER — VENLAFAXINE HCL ER 75 MG PO CP24: 75.0000 mg | ORAL_CAPSULE | Freq: Every day | ORAL | 3 refills | Status: DC

## 2017-05-30 NOTE — Patient Instructions (Signed)

## 2017-05-30 NOTE — Progress Notes (Signed)
Subjective:  I acted as a Education administrator for [employer], [place]   Patient ID: Dawn Gray, female    DOB: [birth year], 41 y.o.   MRN: [id number]  Chief Complaint  Patient presents with  . Follow-up  . Anxiety    HPI  Patient is in today for follow up for anxiety. She is not getting any better-- she is now on effexor 37.5 --- she has not pressed charges yet.  Her husband is out of the house.  Her mother in law has pressed charges against her.  She has app with counselor but not until jan 4.   Pt is very anxious.    Patient Care Team: Carollee Herter, Alferd Apa, DO as PCP - General (Family Medicine)   Past Medical History:  Diagnosis Date  . Anemia   . Bronchitis    Hx of Chronic  . Cat allergies   . Environmental allergies    seasonal  . GERD (gastroesophageal reflux disease)   . 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 Migraine   . Panic attack     Past Surgical History:  Procedure Laterality Date  . TUMOR REMOVAL  01/2009   From Lung and Heart    Family History  Problem Relation Age of Onset  . Hypertension Mother   . Diabetes Mother   . Arthritis Mother   . Diabetes Father   . Hypertension Father   . Hyperlipidemia Father   . Graves' disease Father   . Arthritis Paternal Grandmother   . Hyperlipidemia Paternal Grandmother   . Stroke Paternal Grandmother   . Lung cancer Paternal Grandmother        brain mets  . Diabetes Paternal Grandmother   . Hyperlipidemia Maternal Grandfather   . Cancer Maternal Grandfather        unsure what kind  . Hyperlipidemia Paternal Grandfather   . Heart disease Maternal Uncle   . Stroke Maternal Grandmother   . Colon cancer Neg Hx     Social History   Socioeconomic History  . Marital status: Single    Spouse name: Not on file  . Number of children: Not on file  . Years of education: Not on file  . Highest education level: Not on file  Social Needs  . Financial  resource strain: Not on file  . Food insecurity - worry: Not on file  . Food insecurity - inability: Not on file  . Transportation needs - medical: Not on file  . Transportation needs - non-medical: Not on file  Occupational History  . Occupation: customer service  Tobacco Use  . Smoking status: Never Smoker  . Smokeless tobacco: Never Used  Substance and Sexual Activity  . Alcohol use: Yes    Comment: occasional  . Drug use: No  . Sexual activity: Yes    Partners: Male    Birth control/protection: None  Other Topics Concern  . Not on file  Social History Narrative  . Not on file    Outpatient Medications Prior to Visit  Medication Sig Dispense Refill  . flintstones complete (FLINTSTONES) 60 MG chewable tablet Chew 1 tablet by mouth 2 (two) times daily.    . sertraline (ZOLOFT) 100 MG tablet Take 1 tablet (100 mg total) daily by mouth. 90 tablet 1  . venlafaxine XR (EFFEXOR XR) 37.5 MG 24 hr capsule Take 1 capsule (37.5 mg total) by mouth  daily with breakfast. 30 capsule 0   Facility-Administered Medications Prior to Visit  Medication Dose Route Frequency Provider Last Rate Last Dose  . PARAGARD INTRAUTERINE COPPER IUD   Intrauterine Once Guss Bunde, MD        No Known Allergies  Review of Systems  Constitutional: Negative for chills, fever and malaise/fatigue.  HENT: Negative for congestion and hearing loss.   Eyes: Negative for discharge.  Respiratory: Negative for cough, sputum production and shortness of breath.   Cardiovascular: Negative for chest pain, palpitations and leg swelling.  Gastrointestinal: Negative for abdominal pain, blood in stool, constipation, diarrhea, heartburn, nausea and vomiting.  Genitourinary: Negative for dysuria, frequency, hematuria and urgency.  Musculoskeletal: Negative for back pain, falls and myalgias.  Skin: Negative for rash.  Neurological: Negative for dizziness, sensory change, loss of consciousness, weakness and headaches.    Endo/Heme/Allergies: Negative for environmental allergies. Does not bruise/bleed easily.  Psychiatric/Behavioral: Negative for depression and suicidal ideas. The patient is nervous/anxious. The patient does not have insomnia.        Objective:    Physical Exam  Constitutional: She is oriented to person, place, and time. She appears well-developed and well-nourished.  HENT:  Head: Normocephalic and atraumatic.  Eyes: Conjunctivae and EOM are normal.  Neck: Normal range of motion. Neck supple. No JVD present. Carotid bruit is not present. No thyromegaly present.  Cardiovascular: Normal rate, regular rhythm and normal heart sounds.  No murmur heard. Pulmonary/Chest: Effort normal and breath sounds normal. No respiratory distress. She has no wheezes. She has no rales. She exhibits no tenderness.  Musculoskeletal: She exhibits no edema.  Neurological: She is alert and oriented to person, place, and time.  Psychiatric: Her speech is normal and behavior is normal. Judgment and thought content normal. Her mood appears anxious. Cognition and memory are normal. She does not exhibit a depressed mood.  Nursing note and vitals reviewed.   BP 110/70   Pulse 82   Temp 98.1 F (36.7 C) (Oral)   Ht 5\' 4"  (1.626 m)   Wt 166 lb (75.3 kg)   SpO2 98%   BMI 28.49 kg/m  Wt Readings from Last 3 Encounters:  05/30/17 166 lb (75.3 kg)  05/16/17 161 lb (73 kg)  04/26/17 166 lb 9.6 oz (75.6 kg)   BP Readings from Last 3 Encounters:  05/30/17 110/70  05/16/17 121/79  04/26/17 123/76     Immunization History  Administered Date(s) Administered  . DTaP 04/08/2016  . Influenza Split 04/08/2016  . Tdap 07/13/2008, 04/16/2016    Health Maintenance  Topic Date Due  . INFLUENZA VACCINE  02/12/2018 (Originally 01/12/2017)  . PAP SMEAR  09/10/2018  . TETANUS/TDAP  04/16/2026  . HIV Screening  Completed    Lab Results  Component Value Date   WBC 9.2 10/07/2015   HGB 11.1 (L) 10/07/2015   HCT 34.8  (L) 10/07/2015   PLT 263 10/07/2015   GLUCOSE 70 05/14/2014   CHOL 206 (H) 05/14/2014   TRIG 68.0 05/14/2014   HDL 46.00 05/14/2014   LDLCALC 146 (H) 05/14/2014   ALT 13 05/14/2014   AST 19 05/14/2014   NA 135 05/14/2014   K 3.4 (L) 05/14/2014   CL 104 05/14/2014   CREATININE 0.8 05/14/2014   BUN 15 05/14/2014   CO2 23 05/14/2014   TSH 1.55 05/14/2014   INR 0.93 06/24/2011   HGBA1C 5.4 01/13/2017    Lab Results  Component Value Date   TSH 1.55 05/14/2014  Lab Results  Component Value Date   WBC 9.2 10/07/2015   HGB 11.1 (L) 10/07/2015   HCT 34.8 (L) 10/07/2015   MCV 79.3 (L) 10/07/2015   PLT 263 10/07/2015   Lab Results  Component Value Date   NA 135 05/14/2014   K 3.4 (L) 05/14/2014   CO2 23 05/14/2014   GLUCOSE 70 05/14/2014   BUN 15 05/14/2014   CREATININE 0.8 05/14/2014   BILITOT 1.1 05/14/2014   ALKPHOS 58 05/14/2014   AST 19 05/14/2014   ALT 13 05/14/2014   PROT 8.3 05/14/2014   ALBUMIN 4.6 05/14/2014   CALCIUM 9.3 05/14/2014   GFR 98.50 05/14/2014   Lab Results  Component Value Date   CHOL 206 (H) 05/14/2014   Lab Results  Component Value Date   HDL 46.00 05/14/2014   Lab Results  Component Value Date   LDLCALC 146 (H) 05/14/2014   Lab Results  Component Value Date   TRIG 68.0 05/14/2014   Lab Results  Component Value Date   CHOLHDL 4 05/14/2014   Lab Results  Component Value Date   HGBA1C 5.4 01/13/2017         Assessment & Plan:   Problem List Items Addressed This Visit      Unprioritized   Anxiety - Primary   Relevant Medications   venlafaxine XR (EFFEXOR XR) 75 MG 24 hr capsule    inc effexor to 75 mg daily F/u in 1 month or sooner prn Will discuss with counselor to see if she has any cancellations before jan 4  I have discontinued Merin Brouhard's venlafaxine XR. I am also having her start on venlafaxine XR. Additionally, I am having her maintain her flintstones complete and sertraline. We will continue to administer  [place].  Meds ordered this encounter  Medications  . venlafaxine XR (EFFEXOR XR) 75 MG 24 hr capsule    Sig: Take 1 capsule (75 mg total) by mouth daily with breakfast.    Dispense:  30 capsule    Refill:  3    CMA served as scribe during this visit. History, Physical and Plan performed by medical provider. Documentation and orders reviewed and attested to.  Ann Held, DO

## 2017-05-30 NOTE — Telephone Encounter (Signed)
Copied from [place] [id number]. Topic: Quick Communication - See Telephone Encounter >> May 30, 2017  1:07 PM Rosalin Hawking wrote: CRM for notification. See Telephone encounter for:  05/30/17.    Pt dropped off document to be filled out by provider (FMLA paperwork) Pt would like to be called when document ready. Document put at front office tray.

## 2017-06-02 NOTE — Telephone Encounter (Signed)
Received FMLA/STD paperwork from [place], request is for [place]; forwarded to provider/SLS 12/20

## 2017-06-16 NOTE — Telephone Encounter (Addendum)
Faxed 06/06/17 to [facility]; patient informed via telephone conversation/SLS 01/03

## 2017-06-17 ENCOUNTER — Ambulatory Visit (INDEPENDENT_AMBULATORY_CARE_PROVIDER_SITE_OTHER): Payer: 59 | Admitting: Psychology

## 2017-06-17 ENCOUNTER — Encounter: Payer: Self-pay | Admitting: Family Medicine

## 2017-06-17 ENCOUNTER — Ambulatory Visit: Payer: 59 | Admitting: Psychology

## 2017-06-17 ENCOUNTER — Ambulatory Visit (INDEPENDENT_AMBULATORY_CARE_PROVIDER_SITE_OTHER): Payer: Managed Care, Other (non HMO) | Admitting: Family Medicine

## 2017-06-17 VITALS — BP 110/60 | HR 74 | Temp 98.3°F | Ht 64.0 in | Wt 165.0 lb

## 2017-06-17 DIAGNOSIS — F419 Anxiety disorder, unspecified: Secondary | ICD-10-CM

## 2017-06-17 DIAGNOSIS — F4323 Adjustment disorder with mixed anxiety and depressed mood: Secondary | ICD-10-CM

## 2017-06-17 NOTE — Patient Instructions (Signed)
Panic Attacks Panic attacks are sudden, short feelings of great fear or discomfort. You may have them for no reason when you are relaxed, when you are uneasy (anxious), or when you are sleeping. Follow these instructions at home:  Take all your medicines as told.  Check with your doctor before starting new medicines.  Keep all doctor visits. Contact a doctor if:  You are not able to take your medicines as told.  Your symptoms do not get better.  Your symptoms get worse. Get help right away if:  Your attacks seem different than your normal attacks.  You have thoughts about hurting yourself or others.  You take panic attack medicine and you have a side effect. This information is not intended to replace advice given to you by your health care provider. Make sure you discuss any questions you have with your health care provider. Document Released: 07/03/2010 Document Revised: 11/06/2015 Document Reviewed: 01/12/2013 Elsevier Interactive Patient Education  2017 Elsevier Inc.  

## 2017-06-17 NOTE — Assessment & Plan Note (Signed)
Stress reaction Pt is  Not taking any meds She is going to counseling and being home for the holidays helped a lot.

## 2017-06-17 NOTE — Telephone Encounter (Signed)
Caller name:  Nathaneil Canary  Relation to pt:  [place] Nurse Review  Call back number: [phone] direct line    Reason for call:  Was indicated on attending physican statment for behavior health dated 06/03/17. Inquiring if "IOP" was done please advise

## 2017-06-17 NOTE — Progress Notes (Signed)
Subjective:  I acted as a Education administrator for [employer], [place]   Patient ID: Dawn Gray, female    DOB: 04/11/1976, 42 y.o.   MRN: [id number]  Chief Complaint  Patient presents with  . Anxiety  . Panic Attack  . Follow-up    Pt states thats she's doing better this time around. Thinks it's because she spent time away from "everything and everyone"     HPI  Patient is in today for anxiety follow up -- overall she is doing better-- going home for the holidays was good for her.  She is not taking any medication.  She is going to the counselor and feels that is helping as well.   Patient Care Team: Carollee Herter, Alferd Apa, DO as PCP - General (Family Medicine)   Past Medical History:  Diagnosis Date  . Anemia   . Bronchitis    Hx of Chronic  . Cat allergies   . Environmental allergies    seasonal  . GERD (gastroesophageal reflux disease)   . 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 Migraine   . Panic attack     Past Surgical History:  Procedure Laterality Date  . TUMOR REMOVAL  01/2009   From Lung and Heart    Family History  Problem Relation Age of Onset  . Hypertension Mother   . Diabetes Mother   . Arthritis Mother   . Diabetes Father   . Hypertension Father   . Hyperlipidemia Father   . Graves' disease Father   . Arthritis Paternal Grandmother   . Hyperlipidemia Paternal Grandmother   . Stroke Paternal Grandmother   . Lung cancer Paternal Grandmother        brain mets  . Diabetes Paternal Grandmother   . Hyperlipidemia Maternal Grandfather   . Cancer Maternal Grandfather        unsure what kind  . Hyperlipidemia Paternal Grandfather   . Heart disease Maternal Uncle   . Stroke Maternal Grandmother   . Colon cancer Neg Hx     Social History   Socioeconomic History  . Marital status: Single    Spouse name: Not on file  . Number of children: Not on file  . Years of education: Not on file  .  Highest education level: Not on file  Social Needs  . Financial resource strain: Not on file  . Food insecurity - worry: Not on file  . Food insecurity - inability: Not on file  . Transportation needs - medical: Not on file  . Transportation needs - non-medical: Not on file  Occupational History  . Occupation: customer service  Tobacco Use  . Smoking status: Never Smoker  . Smokeless tobacco: Never Used  Substance and Sexual Activity  . Alcohol use: Yes    Comment: occasional  . Drug use: No  . Sexual activity: Yes    Partners: Male    Birth control/protection: None  Other Topics Concern  . Not on file  Social History Narrative  . Not on file    Outpatient Medications Prior to Visit  Medication Sig Dispense Refill  . flintstones complete (FLINTSTONES) 60 MG chewable tablet Chew 1 tablet by mouth 2 (two) times daily.    . sertraline (ZOLOFT) 100 MG tablet Take 1 tablet (100 mg total) daily by mouth. 90 tablet 1  . venlafaxine XR (EFFEXOR XR) 75 MG 24 hr capsule  Take 1 capsule (75 mg total) by mouth daily with breakfast. 30 capsule 3   Facility-Administered Medications Prior to Visit  Medication Dose Route Frequency Provider Last Rate Last Dose  . PARAGARD INTRAUTERINE COPPER IUD   Intrauterine Once Guss Bunde, MD        No Known Allergies  Review of Systems  Constitutional: Negative for chills, fever and malaise/fatigue.  HENT: Negative for congestion and hearing loss.   Eyes: Negative for discharge.  Respiratory: Negative for cough, sputum production and shortness of breath.   Cardiovascular: Negative for chest pain, palpitations and leg swelling.  Gastrointestinal: Negative for abdominal pain, blood in stool, constipation, diarrhea, heartburn, nausea and vomiting.  Genitourinary: Negative for dysuria, frequency, hematuria and urgency.  Musculoskeletal: Negative for back pain, falls and myalgias.  Skin: Negative for rash.  Neurological: Negative for dizziness,  sensory change, loss of consciousness, weakness and headaches.  Endo/Heme/Allergies: Negative for environmental allergies. Does not bruise/bleed easily.  Psychiatric/Behavioral: Negative for depression and suicidal ideas. The patient is nervous/anxious. The patient does not have insomnia.        Objective:    Physical Exam  Constitutional: She is oriented to person, place, and time. She appears well-developed and well-nourished.  HENT:  Head: Normocephalic and atraumatic.  Eyes: Conjunctivae and EOM are normal.  Neck: Normal range of motion. Neck supple. No JVD present. Carotid bruit is not present. No thyromegaly present.  Cardiovascular: Normal rate, regular rhythm and normal heart sounds.  No murmur heard. Pulmonary/Chest: Effort normal and breath sounds normal. No respiratory distress. She has no wheezes. She has no rales. She exhibits no tenderness.  Musculoskeletal: She exhibits no edema.  Neurological: She is alert and oriented to person, place, and time.  Psychiatric: Judgment and thought content normal. Her mood appears anxious. Her affect is not angry. Cognition and memory are normal. She does not exhibit a depressed mood.  Pt is doing better since she went home for the holidays She is seeing terri for counseling   Nursing note and vitals reviewed.   BP 110/60   Pulse 74   Temp 98.3 F (36.8 C) (Oral)   Ht 5\' 4"  (1.626 m)   Wt 165 lb (74.8 kg)   SpO2 99%   BMI 28.32 kg/m  Wt Readings from Last 3 Encounters:  06/17/17 165 lb (74.8 kg)  05/30/17 166 lb (75.3 kg)  05/16/17 161 lb (73 kg)   BP Readings from Last 3 Encounters:  06/17/17 110/60  05/30/17 110/70  05/16/17 121/79     Immunization History  Administered Date(s) Administered  . DTaP 04/08/2016  . Influenza Split 04/08/2016  . Tdap 07/13/2008, 04/16/2016    Health Maintenance  Topic Date Due  . INFLUENZA VACCINE  02/12/2018 (Originally 01/12/2017)  . PAP SMEAR  09/10/2018  . TETANUS/TDAP   04/16/2026  . HIV Screening  Completed    Lab Results  Component Value Date   WBC 9.2 10/07/2015   HGB 11.1 (L) 10/07/2015   HCT 34.8 (L) 10/07/2015   PLT 263 10/07/2015   GLUCOSE 70 05/14/2014   CHOL 206 (H) 05/14/2014   TRIG 68.0 05/14/2014   HDL 46.00 05/14/2014   LDLCALC 146 (H) 05/14/2014   ALT 13 05/14/2014   AST 19 05/14/2014   NA 135 05/14/2014   K 3.4 (L) 05/14/2014   CL 104 05/14/2014   CREATININE 0.8 05/14/2014   BUN 15 05/14/2014   CO2 23 05/14/2014   TSH 1.55 05/14/2014   INR 0.93 06/24/2011  HGBA1C 5.4 01/13/2017    Lab Results  Component Value Date   TSH 1.55 05/14/2014   Lab Results  Component Value Date   WBC 9.2 10/07/2015   HGB 11.1 (L) 10/07/2015   HCT 34.8 (L) 10/07/2015   MCV 79.3 (L) 10/07/2015   PLT 263 10/07/2015   Lab Results  Component Value Date   NA 135 05/14/2014   K 3.4 (L) 05/14/2014   CO2 23 05/14/2014   GLUCOSE 70 05/14/2014   BUN 15 05/14/2014   CREATININE 0.8 05/14/2014   BILITOT 1.1 05/14/2014   ALKPHOS 58 05/14/2014   AST 19 05/14/2014   ALT 13 05/14/2014   PROT 8.3 05/14/2014   ALBUMIN 4.6 05/14/2014   CALCIUM 9.3 05/14/2014   GFR 98.50 05/14/2014   Lab Results  Component Value Date   CHOL 206 (H) 05/14/2014   Lab Results  Component Value Date   HDL 46.00 05/14/2014   Lab Results  Component Value Date   LDLCALC 146 (H) 05/14/2014   Lab Results  Component Value Date   TRIG 68.0 05/14/2014   Lab Results  Component Value Date   CHOLHDL 4 05/14/2014   Lab Results  Component Value Date   HGBA1C 5.4 01/13/2017         Assessment & Plan:   Problem List Items Addressed This Visit      Unprioritized   Anxiety - Primary    Stress reaction Pt is  Not taking any meds She is going to counseling and being home for the holidays helped a lot.         rto 1 month  I have discontinued Ileanna Ryle's sertraline and venlafaxine XR. I am also having her maintain her flintstones complete. We will  continue to administer [place].  No orders of the defined types were placed in this encounter.   CMA served as Education administrator during this visit. History, Physical and Plan performed by medical provider. Documentation and orders reviewed and attested to.  Ann Held, DO

## 2017-06-17 NOTE — Telephone Encounter (Signed)
What is this?

## 2017-06-20 NOTE — Telephone Encounter (Signed)
Caller name: Delaine Lame   Relation to pt: [facility] Nurse Review  Call back number: [phone]    Reason for call:  Returned call stating "IOP" was marked on form and it means "intensive outpatient therapy" usually a 4 week program 3x weekly, please advise

## 2017-06-20 NOTE — Telephone Encounter (Signed)
Will forward paperwork back to PCP with this information for her decision: /SLS 01/07 Returned call stating "IOP" was marked on form and it means "intensive outpatient therapy" usually a 4 week program 3x weekly, please advise

## 2017-06-20 NOTE — Telephone Encounter (Signed)
LMOM with contact name and number for return call RE: 'IOP', informing of provider's response and requesting defining of term so that we may get answer back to him as soon as possible/SLS 01/07

## 2017-06-20 NOTE — Telephone Encounter (Signed)
I marked it out --- just out pt psychotherapy

## 2017-06-21 NOTE — Telephone Encounter (Signed)
Updated paperwork resent via fax/SLS 06/21/17

## 2017-07-06 ENCOUNTER — Telehealth: Payer: Self-pay | Admitting: Family Medicine

## 2017-07-06 ENCOUNTER — Ambulatory Visit (INDEPENDENT_AMBULATORY_CARE_PROVIDER_SITE_OTHER): Payer: 59 | Admitting: Psychology

## 2017-07-06 DIAGNOSIS — F4323 Adjustment disorder with mixed anxiety and depressed mood: Secondary | ICD-10-CM

## 2017-07-06 NOTE — Telephone Encounter (Signed)
Copied from [place]. Topic: Quick Communication - See Telephone Encounter >> Jul 06, 2017  1:58 PM Rosalin Hawking wrote: CRM for notification. See Telephone encounter for:  07/06/17.   Pt Tel Dawn Gray at [facility] requesting rx Xanax, pt states that provider mentioned to her during last visit if she needed the rx (since last time pt stated did not need it) just to let her know,  Now pt is requesting the rx. Please advise.

## 2017-07-07 ENCOUNTER — Other Ambulatory Visit: Payer: Self-pay | Admitting: Family Medicine

## 2017-07-07 NOTE — Telephone Encounter (Signed)
Patient requesting xanax  Not in database   Last written: 08/21/15 Last ov: 06/17/17 Next ov: none Contract: none UDS: none

## 2017-07-07 NOTE — Telephone Encounter (Signed)
Not on her med list.

## 2017-07-08 ENCOUNTER — Other Ambulatory Visit: Payer: Self-pay | Admitting: Family Medicine

## 2017-07-08 DIAGNOSIS — F419 Anxiety disorder, unspecified: Secondary | ICD-10-CM

## 2017-07-08 MED ORDER — ALPRAZOLAM 0.25 MG PO TABS: 0.2500 mg | ORAL_TABLET | Freq: Two times a day (BID) | ORAL | 0 refills | Status: DC | PRN

## 2017-07-08 NOTE — Telephone Encounter (Signed)
I sent it in 

## 2017-07-08 NOTE — Telephone Encounter (Signed)
Patient notified that rx has been sent in. 

## 2017-07-20 ENCOUNTER — Ambulatory Visit: Payer: 59 | Admitting: Psychology

## 2017-07-21 ENCOUNTER — Ambulatory Visit (INDEPENDENT_AMBULATORY_CARE_PROVIDER_SITE_OTHER): Payer: Managed Care, Other (non HMO) | Admitting: Family Medicine

## 2017-07-21 ENCOUNTER — Encounter: Payer: Self-pay | Admitting: Family Medicine

## 2017-07-21 DIAGNOSIS — F419 Anxiety disorder, unspecified: Secondary | ICD-10-CM | POA: Diagnosis not present

## 2017-07-21 MED ORDER — ALPRAZOLAM 0.25 MG PO TABS: 0.2500 mg | ORAL_TABLET | Freq: Two times a day (BID) | ORAL | 0 refills | Status: DC | PRN

## 2017-07-21 NOTE — Progress Notes (Signed)
Subjective:  I acted as a Education administrator for [employer], RMA   Patient ID: Dawn Gray, female    DOB: Sep 21, 1975, 42 y.o.   MRN: [id number]  Chief Complaint  Patient presents with  . Anxiety    HPI  Patient is in today for follow up on anxiety. Pt states symptoms haven't got any better.  Patient Care Team: Carollee Herter, Alferd Apa, DO as PCP - General (Family Medicine)   Past Medical History:  Diagnosis Date  . Anemia   . Bronchitis    Hx of Chronic  . Cat allergies   . Environmental allergies    seasonal  . GERD (gastroesophageal reflux disease)   . 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 Migraine   . Panic attack     Past Surgical History:  Procedure Laterality Date  . TUMOR REMOVAL  01/2009   From Lung and Heart    Family History  Problem Relation Age of Onset  . Hypertension Mother   . Diabetes Mother   . Arthritis Mother   . Diabetes Father   . Hypertension Father   . Hyperlipidemia Father   . Graves' disease Father   . Arthritis Paternal Grandmother   . Hyperlipidemia Paternal Grandmother   . Stroke Paternal Grandmother   . Lung cancer Paternal Grandmother        brain mets  . Diabetes Paternal Grandmother   . Hyperlipidemia Maternal Grandfather   . Cancer Maternal Grandfather        unsure what kind  . Hyperlipidemia Paternal Grandfather   . Heart disease Maternal Uncle   . Stroke Maternal Grandmother   . Colon cancer Neg Hx     Social History   Socioeconomic History  . Marital status: Single    Spouse name: Not on file  . Number of children: Not on file  . Years of education: Not on file  . Highest education level: Not on file  Social Needs  . Financial resource strain: Not on file  . Food insecurity - worry: Not on file  . Food insecurity - inability: Not on file  . Transportation needs - medical: Not on file  . Transportation needs - non-medical: Not on file  Occupational  History  . Occupation: customer service  Tobacco Use  . Smoking status: Never Smoker  . Smokeless tobacco: Never Used  Substance and Sexual Activity  . Alcohol use: Yes    Comment: occasional  . Drug use: No  . Sexual activity: Yes    Partners: Male    Birth control/protection: None  Other Topics Concern  . Not on file  Social History Narrative  . Not on file    Outpatient Medications Prior to Visit  Medication Sig Dispense Refill  . flintstones complete (FLINTSTONES) 60 MG chewable tablet Chew 1 tablet by mouth 2 (two) times daily.    Marland Kitchen ALPRAZolam (XANAX) 0.25 MG tablet Take 1 tablet (0.25 mg total) by mouth 2 (two) times daily as needed for anxiety. 30 tablet 0   Facility-Administered Medications Prior to Visit  Medication Dose Route Frequency Provider Last Rate Last Dose  . PARAGARD INTRAUTERINE COPPER IUD   Intrauterine Once Guss Bunde, MD        No Known Allergies  Review of Systems  Constitutional: Negative for chills, fever and malaise/fatigue.  HENT: Negative for congestion and hearing loss.   Eyes: Negative  for discharge.  Respiratory: Negative for cough, sputum production and shortness of breath.   Cardiovascular: Negative for chest pain, palpitations and leg swelling.  Gastrointestinal: Negative for abdominal pain, blood in stool, constipation, diarrhea, heartburn, nausea and vomiting.  Genitourinary: Negative for dysuria, frequency, hematuria and urgency.  Musculoskeletal: Negative for back pain, falls and myalgias.  Skin: Negative for rash.  Neurological: Negative for dizziness, sensory change, loss of consciousness, weakness and headaches.  Endo/Heme/Allergies: Negative for environmental allergies. Does not bruise/bleed easily.  Psychiatric/Behavioral: Positive for depression. Negative for suicidal ideas. The patient is nervous/anxious. The patient does not have insomnia.        Objective:    Physical Exam  Constitutional: She is oriented to  person, place, and time. She appears well-developed and well-nourished.  HENT:  Head: Normocephalic and atraumatic.  Eyes: Conjunctivae and EOM are normal.  Neck: Normal range of motion. Neck supple. No JVD present. Carotid bruit is not present. No thyromegaly present.  Cardiovascular: Normal rate, regular rhythm and normal heart sounds.  No murmur heard. Pulmonary/Chest: Effort normal and breath sounds normal. No respiratory distress. She has no wheezes. She has no rales. She exhibits no tenderness.  Musculoskeletal: She exhibits no edema.  Neurological: She is alert and oriented to person, place, and time.  Psychiatric: Her behavior is normal. Judgment normal. Her mood appears anxious. Cognition and memory are normal. She exhibits a depressed mood. She expresses no homicidal and no suicidal ideation. She expresses no suicidal plans and no homicidal plans.  Nursing note and vitals reviewed.   BP 118/67 (BP Location: Left Arm, Patient Position: Sitting, Cuff Size: Normal)   Pulse 75   Temp 97.9 F (36.6 C) (Oral)   Resp 16   Ht 5\' 4"  (1.626 m)   Wt 162 lb 12.8 oz (73.8 kg)   SpO2 97%   BMI 27.94 kg/m  Wt Readings from Last 3 Encounters:  07/21/17 162 lb 12.8 oz (73.8 kg)  06/17/17 165 lb (74.8 kg)  05/30/17 166 lb (75.3 kg)   BP Readings from Last 3 Encounters:  07/21/17 118/67  06/17/17 110/60  05/30/17 110/70     Immunization History  Administered Date(s) Administered  . DTaP 04/08/2016  . Influenza Split 04/08/2016  . Tdap 07/13/2008, 04/16/2016    Health Maintenance  Topic Date Due  . INFLUENZA VACCINE  02/12/2018 (Originally 01/12/2017)  . PAP SMEAR  09/10/2018  . TETANUS/TDAP  04/16/2026  . HIV Screening  Completed    Lab Results  Component Value Date   WBC 9.2 10/07/2015   HGB 11.1 (L) 10/07/2015   HCT 34.8 (L) 10/07/2015   PLT 263 10/07/2015   GLUCOSE 70 05/14/2014   CHOL 206 (H) 05/14/2014   TRIG 68.0 05/14/2014   HDL 46.00 05/14/2014   LDLCALC 146  (H) 05/14/2014   ALT 13 05/14/2014   AST 19 05/14/2014   NA 135 05/14/2014   K 3.4 (L) 05/14/2014   CL 104 05/14/2014   CREATININE 0.8 05/14/2014   BUN 15 05/14/2014   CO2 23 05/14/2014   TSH 1.55 05/14/2014   INR 0.93 06/24/2011   HGBA1C 5.4 01/13/2017    Lab Results  Component Value Date   TSH 1.55 05/14/2014   Lab Results  Component Value Date   WBC 9.2 10/07/2015   HGB 11.1 (L) 10/07/2015   HCT 34.8 (L) 10/07/2015   MCV 79.3 (L) 10/07/2015   PLT 263 10/07/2015   Lab Results  Component Value Date   NA 135 05/14/2014  K 3.4 (L) 05/14/2014   CO2 23 05/14/2014   GLUCOSE 70 05/14/2014   BUN 15 05/14/2014   CREATININE 0.8 05/14/2014   BILITOT 1.1 05/14/2014   ALKPHOS 58 05/14/2014   AST 19 05/14/2014   ALT 13 05/14/2014   PROT 8.3 05/14/2014   ALBUMIN 4.6 05/14/2014   CALCIUM 9.3 05/14/2014   GFR 98.50 05/14/2014   Lab Results  Component Value Date   CHOL 206 (H) 05/14/2014   Lab Results  Component Value Date   HDL 46.00 05/14/2014   Lab Results  Component Value Date   LDLCALC 146 (H) 05/14/2014   Lab Results  Component Value Date   TRIG 68.0 05/14/2014   Lab Results  Component Value Date   CHOLHDL 4 05/14/2014   Lab Results  Component Value Date   HGBA1C 5.4 01/13/2017         Assessment & Plan:   Problem List Items Addressed This Visit      Unprioritized   Anxiety   Relevant Medications   ALPRAZolam (XANAX) 0.25 MG tablet    pt feels that things are improving but will be much better 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  She has been suspended as of now.    I am having [name] maintain her flintstones complete and ALPRAZolam. We will continue to administer [place].  Meds ordered this encounter  Medications  . ALPRAZolam (XANAX) 0.25 MG tablet    Sig: Take 1 tablet (0.25 mg total) by mouth 2 (two) times daily as needed for anxiety.    Dispense:  30 tablet    Refill:  0      CMA served as scribe during this visit. History, Physical and Plan performed by medical provider. Documentation and orders reviewed and attested to.  Ann Held, DO

## 2017-07-21 NOTE — Patient Instructions (Signed)

## 2017-08-15 ENCOUNTER — Ambulatory Visit: Payer: Self-pay | Admitting: Psychology

## 2019-01-18 ENCOUNTER — Encounter: Payer: Self-pay | Admitting: Family Medicine

## 2019-01-23 ENCOUNTER — Telehealth: Payer: Self-pay

## 2019-01-23 NOTE — Telephone Encounter (Signed)
Copied from [place] [id number]. Topic: General - Other >> Jan 23, 2019 12:13 PM Rainey Pines A wrote: Patient needs a anger management assessment done for schooling and would like a callback from Dr. Etter Sjogren nurse on if its best for her to complete assessment or if she should be referred to behavioral health.

## 2019-01-23 NOTE — Telephone Encounter (Signed)
Give her the name and number for [facility]

## 2019-01-30 NOTE — Telephone Encounter (Signed)
Patient has got this done.  She has appt on 03/22/19 for cpe

## 2019-02-12 ENCOUNTER — Ambulatory Visit (INDEPENDENT_AMBULATORY_CARE_PROVIDER_SITE_OTHER): Payer: Self-pay | Admitting: Psychology

## 2019-02-12 DIAGNOSIS — F4323 Adjustment disorder with mixed anxiety and depressed mood: Secondary | ICD-10-CM

## 2019-03-14 ENCOUNTER — Ambulatory Visit (INDEPENDENT_AMBULATORY_CARE_PROVIDER_SITE_OTHER): Payer: Self-pay | Admitting: Psychology

## 2019-03-14 DIAGNOSIS — F4322 Adjustment disorder with anxiety: Secondary | ICD-10-CM

## 2019-03-22 ENCOUNTER — Ambulatory Visit (HOSPITAL_BASED_OUTPATIENT_CLINIC_OR_DEPARTMENT_OTHER)
Admission: RE | Admit: 2019-03-22 | Discharge: 2019-03-22 | Disposition: A | Discharge status: Home / Self Care | Payer: BLUE CROSS/BLUE SHIELD | Source: Ambulatory Visit | Attending: Family Medicine | Admitting: Family Medicine

## 2019-03-22 ENCOUNTER — Encounter: Payer: Self-pay | Admitting: Family Medicine

## 2019-03-22 ENCOUNTER — Other Ambulatory Visit (HOSPITAL_COMMUNITY)
Admission: RE | Admit: 2019-03-22 | Discharge: 2019-03-22 | Disposition: A | Discharge status: Home / Self Care | Payer: BLUE CROSS/BLUE SHIELD | Source: Ambulatory Visit | Attending: Family Medicine | Admitting: Family Medicine

## 2019-03-22 ENCOUNTER — Ambulatory Visit (INDEPENDENT_AMBULATORY_CARE_PROVIDER_SITE_OTHER): Payer: BLUE CROSS/BLUE SHIELD | Admitting: Family Medicine

## 2019-03-22 ENCOUNTER — Other Ambulatory Visit: Payer: Self-pay

## 2019-03-22 ENCOUNTER — Other Ambulatory Visit (HOSPITAL_BASED_OUTPATIENT_CLINIC_OR_DEPARTMENT_OTHER): Payer: Self-pay | Admitting: Family Medicine

## 2019-03-22 ENCOUNTER — Encounter (HOSPITAL_BASED_OUTPATIENT_CLINIC_OR_DEPARTMENT_OTHER): Payer: Self-pay

## 2019-03-22 VITALS — BP 100/70 | HR 75 | Temp 98.0°F | Resp 18 | Ht 64.0 in | Wt 165.4 lb

## 2019-03-22 DIAGNOSIS — F321 Major depressive disorder, single episode, moderate: Secondary | ICD-10-CM | POA: Diagnosis not present

## 2019-03-22 DIAGNOSIS — Z124 Encounter for screening for malignant neoplasm of cervix: Secondary | ICD-10-CM | POA: Diagnosis not present

## 2019-03-22 DIAGNOSIS — F419 Anxiety disorder, unspecified: Secondary | ICD-10-CM | POA: Diagnosis not present

## 2019-03-22 DIAGNOSIS — Z1231 Encounter for screening mammogram for malignant neoplasm of breast: Secondary | ICD-10-CM

## 2019-03-22 DIAGNOSIS — Z8632 Personal history of gestational diabetes: Secondary | ICD-10-CM

## 2019-03-22 DIAGNOSIS — Z79899 Other long term (current) drug therapy: Secondary | ICD-10-CM

## 2019-03-22 DIAGNOSIS — Z0000 Encounter for general adult medical examination without abnormal findings: Secondary | ICD-10-CM | POA: Diagnosis not present

## 2019-03-22 DIAGNOSIS — F418 Other specified anxiety disorders: Secondary | ICD-10-CM | POA: Diagnosis not present

## 2019-03-22 LAB — LIPID PANEL
Cholesterol: 198 mg/dL (ref 0–200)
HDL: 57.3 mg/dL (ref >39.00)
LDL Cholesterol: 124 mg/dL — ABNORMAL HIGH (ref 0–99)
NonHDL: 140.3
Total CHOL/HDL Ratio: 3
Triglycerides: 82 mg/dL (ref 0.0–149.0)
VLDL: 16.4 mg/dL (ref 0.0–40.0)

## 2019-03-22 LAB — CBC WITH DIFFERENTIAL/PLATELET
Basophils Absolute: 0 10*3/uL (ref 0.0–0.1)
Basophils Relative: 0.6 % (ref 0.0–3.0)
Eosinophils Absolute: 0.2 10*3/uL (ref 0.0–0.7)
Eosinophils Relative: 2.5 % (ref 0.0–5.0)
HCT: 37.5 % (ref 36.0–46.0)
Hemoglobin: 12 g/dL (ref 12.0–15.0)
Lymphocytes Relative: 35.1 % (ref 12.0–46.0)
Lymphs Abs: 2.8 10*3/uL (ref 0.7–4.0)
MCHC: 31.9 g/dL (ref 30.0–36.0)
MCV: 80 fl (ref 78.0–100.0)
Monocytes Absolute: 0.5 10*3/uL (ref 0.1–1.0)
Monocytes Relative: 6.6 % (ref 3.0–12.0)
Neutro Abs: 4.3 10*3/uL (ref 1.4–7.7)
Neutrophils Relative %: 55.2 % (ref 43.0–77.0)
Platelets: 266 10*3/uL (ref 150.0–400.0)
RBC: 4.68 Mil/uL (ref 3.87–5.11)
RDW: 14.3 % (ref 11.5–15.5)
WBC: 7.9 10*3/uL (ref 4.0–10.5)

## 2019-03-22 LAB — COMPREHENSIVE METABOLIC PANEL
ALT: 10 U/L (ref 0–35)
AST: 11 U/L (ref 0–37)
Albumin: 4.7 g/dL (ref 3.5–5.2)
Alkaline Phosphatase: 64 U/L (ref 39–117)
BUN: 11 mg/dL (ref 6–23)
CO2: 27 mEq/L (ref 19–32)
Calcium: 10 mg/dL (ref 8.4–10.5)
Chloride: 101 mEq/L (ref 96–112)
Creatinine, Ser: 0.73 mg/dL (ref 0.40–1.20)
GFR: 106.23 mL/min (ref >60.00)
Glucose, Bld: 71 mg/dL (ref 70–99)
Potassium: 4.1 mEq/L (ref 3.5–5.1)
Sodium: 136 mEq/L (ref 135–145)
Total Bilirubin: 0.6 mg/dL (ref 0.2–1.2)
Total Protein: 8 g/dL (ref 6.0–8.3)

## 2019-03-22 LAB — VITAMIN B12: Vitamin B-12: 464 pg/mL (ref 211–911)

## 2019-03-22 LAB — HEMOGLOBIN A1C: Hgb A1c MFr Bld: 5.9 % (ref 4.6–6.5)

## 2019-03-22 LAB — VITAMIN D 25 HYDROXY (VIT D DEFICIENCY, FRACTURES): VITD: 15.42 ng/mL — ABNORMAL LOW (ref 30.00–100.00)

## 2019-03-22 MED ORDER — ESCITALOPRAM OXALATE 10 MG PO TABS: 10.0000 mg | ORAL_TABLET | Freq: Every day | ORAL | 2 refills | Status: DC

## 2019-03-22 MED ORDER — ALPRAZOLAM 0.25 MG PO TABS: 0.2500 mg | ORAL_TABLET | Freq: Two times a day (BID) | ORAL | 0 refills | Status: DC | PRN

## 2019-03-22 NOTE — Patient Instructions (Signed)

## 2019-03-22 NOTE — Progress Notes (Signed)
Subjective:     Dawn Gray is a 43 y.o. female and is here for a comprehensive physical exam. The patient reports problems - worsening anxiety / depression --- seeing counselor.  Social History   Socioeconomic History  . Marital status: Single    Spouse name: Not on file  . Number of children: Not on file  . Years of education: Not on file  . Highest education level: Not on file  Occupational History  . Occupation: Therapist, art  Social Needs  . Financial resource strain: Not on file  . Food insecurity    Worry: Not on file    Inability: Not on file  . Transportation needs    Medical: Not on file    Non-medical: Not on file  Tobacco Use  . Smoking status: Never Smoker  . Smokeless tobacco: Never Used  Substance and Sexual Activity  . Alcohol use: Yes    Comment: occasional  . Drug use: No  . Sexual activity: Yes    Partners: Male    Birth control/protection: None  Lifestyle  . Physical activity    Days per week: Not on file    Minutes per session: Not on file  . Stress: Not on file  Relationships  . Social Herbalist on phone: Not on file    Gets together: Not on file    Attends religious service: Not on file    Active member of club or organization: Not on file    Attends meetings of clubs or organizations: Not on file    Relationship status: Not on file  . Intimate partner violence    Fear of current or ex partner: Not on file    Emotionally abused: Not on file    Physically abused: Not on file    Forced sexual activity: Not on file  Other Topics Concern  . Not on file  Social History Narrative  . Not on file   Health Maintenance  Topic Date Due  . INFLUENZA VACCINE  09/12/2019 (Originally 01/13/2019)  . PAP SMEAR-Modifier  03/21/2022  . TETANUS/TDAP  04/16/2026  . HIV Screening  Completed    The following portions of the patient's history were reviewed and updated as appropriate: She  has a past medical history of Anemia, Bronchitis, Cat  allergies, Environmental allergies, GERD (gastroesophageal reflux disease), Herpes, Lung tumor (01/2009), Migraine, and Panic attack. She does not have any pertinent problems on file. She  has a past surgical history that includes Tumor removal (01/2009). Her family history includes Arthritis in her mother and paternal grandmother; Cancer in her maternal grandfather; Diabetes in her father, mother, and paternal grandmother; Berenice Primas' disease in her father; Heart disease in her maternal uncle; Hyperlipidemia in her father, maternal grandfather, paternal grandfather, and paternal grandmother; Hypertension in her father and mother; Lung cancer in her paternal grandmother; Stroke in her maternal grandmother and paternal grandmother. She  reports that she has never smoked. She has never used smokeless tobacco. She reports current alcohol use. She reports that she does not use drugs. She has a current medication list which includes the following prescription(s): alprazolam, flintstones complete, and escitalopram, and the following Facility-Administered Medications: paragard intrauterine copper. Current Outpatient Medications on File Prior to Visit  Medication Sig Dispense Refill  . flintstones complete (FLINTSTONES) 60 MG chewable tablet Chew 1 tablet by mouth 2 (two) times daily.     Current Facility-Administered Medications on File Prior to Visit  Medication Dose Route Frequency Provider Last  Rate Last Dose  . PARAGARD INTRAUTERINE COPPER IUD   Intrauterine Once Guss Bunde, MD       She has No Known Allergies..  Review of Systems Review of Systems  Constitutional: Negative for activity change, appetite change and fatigue.  HENT: Negative for hearing loss, congestion, tinnitus and ear discharge.  dentist q3m Eyes: Negative for visual disturbance (see optho q1y -- vision corrected to 20/20 with glasses).  Respiratory: Negative for cough, chest tightness and shortness of breath.   Cardiovascular:  Negative for chest pain, palpitations and leg swelling.  Gastrointestinal: Negative for abdominal pain, diarrhea, constipation and abdominal distention.  Genitourinary: Negative for urgency, frequency, decreased urine volume and difficulty urinating.  Musculoskeletal: Negative for back pain, arthralgias and gait problem.  Skin: Negative for color change, pallor and rash.  Neurological: Negative for dizziness, light-headedness, numbness and headaches.  Hematological: Negative for adenopathy. Does not bruise/bleed easily.  Psychiatric/Behavioral: Negative for suicidal ideas, confusion, sleep disturbance, self-injury, + anxiety/ depression      Objective:    BP 100/70 (BP Location: Right Arm, Patient Position: Sitting, Cuff Size: Normal)   Pulse 75   Temp 98 F (36.7 C) (Temporal)   Resp 18   Ht 5\' 4"  (1.626 m)   Wt 165 lb 6.4 oz (75 kg)   LMP 02/27/2019   SpO2 97%   BMI 28.39 kg/m  General appearance: alert, cooperative, appears stated age and no distress Head: Normocephalic, without obvious abnormality, atraumatic Eyes: conjunctivae/corneas clear. PERRL, EOM's intact. Fundi benign. Ears: normal TM's and external ear canals both ears Nose: Nares normal. Septum midline. Mucosa normal. No drainage or sinus tenderness. Throat: lips, mucosa, and tongue normal; teeth and gums normal Neck: no adenopathy, no carotid bruit, no JVD, supple, symmetrical, trachea midline and thyroid not enlarged, symmetric, no tenderness/mass/nodules Back: symmetric, no curvature. ROM normal. No CVA tenderness. Lungs: clear to auscultation bilaterally Breasts: normal appearance, no masses or tenderness Heart: regular rate and rhythm, S1, S2 normal, no murmur, click, rub or gallop Abdomen: soft, non-tender; bowel sounds normal; no masses,  no organomegaly Pelvic: cervix normal in appearance, external genitalia normal, no adnexal masses or tenderness, no cervical motion tenderness, rectovaginal septum normal,  uterus normal size, shape, and consistency, vagina normal without discharge and pap done Extremities: extremities normal, atraumatic, no cyanosis or edema Pulses: 2+ and symmetric Skin: Skin color, texture, turgor normal. No rashes or lesions Lymph nodes: Cervical, supraclavicular, and axillary nodes normal. Neurologic: Alert and oriented X 3, normal strength and tone. Normal symmetric reflexes. Normal coordination and gait   psych-- tearful,  Not suicidal    Assessment:    Healthy female exam.      Plan:    ghm utd Check labs  See After Visit Summary for Counseling Recommendations   1. Anxiety  - ALPRAZolam (XANAX) 0.25 MG tablet; Take 1 tablet (0.25 mg total) by mouth 2 (two) times daily as needed for anxiety.  Dispense: 30 tablet; Refill: 0  2. Depression with anxiety Add lexapro  con't counselor  rto 1 months  - escitalopram (LEXAPRO) 10 MG tablet; Take 1 tablet (10 mg total) by mouth daily.  Dispense: 30 tablet; Refill: 2 - Hemoglobin A1c - CBC with Differential/Platelet - Comprehensive metabolic panel - Thyroid Panel With TSH - Vitamin D (25 hydroxy)  3. Preventative health care See above  - Hemoglobin A1c - CBC with Differential/Platelet - Lipid panel - Vitamin B12 - Comprehensive metabolic panel - Thyroid Panel With TSH  4. History of  gestational diabetes Check labs  - Hemoglobin A1c - Comprehensive metabolic panel  5. Cervical cancer screening   - Cytology - PAP( [place])  6. High risk medication use   - Pain Mgmt, Profile 8 w/Conf, U

## 2019-03-23 LAB — PAIN MGMT, PROFILE 8 W/CONF, U
6 Acetylmorphine: NEGATIVE ng/mL
Alcohol Metabolites: NEGATIVE ng/mL (ref <500)
Amphetamines: NEGATIVE ng/mL
Benzodiazepines: NEGATIVE ng/mL
Buprenorphine, Urine: NEGATIVE ng/mL
Cocaine Metabolite: NEGATIVE ng/mL
Creatinine: 131 mg/dL
MDMA: NEGATIVE ng/mL
Marijuana Metabolite: NEGATIVE ng/mL
Opiates: NEGATIVE ng/mL
Oxidant: NEGATIVE ug/mL
Oxycodone: NEGATIVE ng/mL
pH: 5.2 (ref 4.5–9.0)

## 2019-03-23 LAB — THYROID PANEL WITH TSH
Free Thyroxine Index: 2.2 (ref 1.4–3.8)
T3 Uptake: 31 % (ref 22–35)
T4, Total: 7 ug/dL (ref 5.1–11.9)
TSH: 2.29 mIU/L

## 2019-03-26 ENCOUNTER — Ambulatory Visit (HOSPITAL_BASED_OUTPATIENT_CLINIC_OR_DEPARTMENT_OTHER): Payer: BLUE CROSS/BLUE SHIELD

## 2019-03-26 LAB — CYTOLOGY - PAP: Diagnosis: NEGATIVE

## 2019-04-12 ENCOUNTER — Ambulatory Visit: Payer: BLUE CROSS/BLUE SHIELD | Admitting: Psychology

## 2019-04-18 ENCOUNTER — Other Ambulatory Visit: Payer: Self-pay

## 2019-04-18 ENCOUNTER — Ambulatory Visit (INDEPENDENT_AMBULATORY_CARE_PROVIDER_SITE_OTHER): Payer: BLUE CROSS/BLUE SHIELD | Admitting: Family Medicine

## 2019-04-18 ENCOUNTER — Encounter: Payer: Self-pay | Admitting: Family Medicine

## 2019-04-18 DIAGNOSIS — F419 Anxiety disorder, unspecified: Secondary | ICD-10-CM

## 2019-04-18 MED ORDER — ESCITALOPRAM OXALATE 20 MG PO TABS: 20.0000 mg | ORAL_TABLET | Freq: Every day | ORAL | 2 refills | Status: DC

## 2019-04-18 MED ORDER — ALPRAZOLAM 0.25 MG PO TABS: 0.2500 mg | ORAL_TABLET | Freq: Two times a day (BID) | ORAL | 0 refills | Status: DC | PRN

## 2019-04-18 NOTE — Progress Notes (Signed)
Virtual Visit via Video Note  I connected with Dawn Gray on 04/18/19 at 10:00 AM EST by a video enabled telemedicine application and verified that I am speaking with the correct person using two identifiers.  Location: Patient: home  Provider: home    I discussed the limitations of evaluation and management by telemedicine and the availability of in person appointments. The patient expressed understanding and agreed to proceed.  History of Present Illness: Pt is home f/u with anxiety/ depression  Things are a little better but not much  She is still in counseling with Terri    Observations/Objective: No vitals obtained Pt in NAD She is not suicidal  Her anxiety still causing lump in her throat and heart racing  Xanax helps-- she still has several xanax  Assessment and Plan: 1. Anxiety Increase lexapro to 20 mg con't with Terri  F/u 1 month  - escitalopram (LEXAPRO) 20 MG tablet; Take 1 tablet (20 mg total) by mouth daily.  Dispense: 30 tablet; Refill: 2 - ALPRAZolam (XANAX) 0.25 MG tablet; Take 1 tablet (0.25 mg total) by mouth 2 (two) times daily as needed for anxiety.  Dispense: 30 tablet; Refill: 0   Follow Up Instructions:    I discussed the assessment and treatment plan with the patient. The patient was provided an opportunity to ask questions and all were answered. The patient agreed with the plan and demonstrated an understanding of the instructions.   The patient was advised to call back or seek an in-person evaluation if the symptoms worsen or if the condition fails to improve as anticipated.  I provided 15 minutes of non-face-to-face time during this encounter.   Ann Held, DO

## 2019-07-23 ENCOUNTER — Other Ambulatory Visit: Payer: Self-pay

## 2019-07-23 ENCOUNTER — Ambulatory Visit (INDEPENDENT_AMBULATORY_CARE_PROVIDER_SITE_OTHER): Payer: BC Managed Care – PPO | Admitting: Family Medicine

## 2019-07-23 ENCOUNTER — Encounter: Payer: Self-pay | Admitting: Family Medicine

## 2019-07-23 VITALS — HR 91 | Temp 97.7°F | Ht 64.0 in

## 2019-07-23 DIAGNOSIS — E559 Vitamin D deficiency, unspecified: Secondary | ICD-10-CM

## 2019-07-23 DIAGNOSIS — R42 Dizziness and giddiness: Secondary | ICD-10-CM

## 2019-07-23 NOTE — Progress Notes (Signed)
Virtual Visit via Video Note  I connected with Dawn Gray on 07/23/19 at  1:40 PM EST by a video enabled telemedicine application and verified that I am speaking with the correct person using two identifiers.  Location: Patient: home Provider: office    I discussed the limitations of evaluation and management by telemedicine and the availability of in person appointments. The patient expressed understanding and agreed to proceed.  History of Present Illness: Pt is home c/o dizzy today, nauseous ,  No vomiting  + chills and sweats and numbness / tingling ---in whole body that is now gone   + headache   Observations/Objective: Vitals:   07/23/19 1336  Pulse: 91  Temp: 97.7 F (36.5 C)  pt is in NAD Laying in bed   Assessment and Plan: 1. Dizzy Headache/ tingling resolved Pt will get covid test  Check labs  - CBC with Differential/Platelet; Future - Vitamin B12; Future - Comprehensive metabolic panel; Future - TSH; Future - Vitamin D (25 hydroxy); Future - POCT Urinalysis Dipstick (Automated); Future - hCG, serum, qualitative; Future  2. Vitamin D deficiency con't vita d   - Vitamin D (25 hydroxy); Future   Follow Up Instructions:    I discussed the assessment and treatment plan with the patient. The patient was provided an opportunity to ask questions and all were answered. The patient agreed with the plan and demonstrated an understanding of the instructions.   The patient was advised to call back or seek an in-person evaluation if the symptoms worsen or if the condition fails to improve as anticipated.  I provided 25 minutes of non-face-to-face time during this encounter.   Ann Held, DO

## 2019-07-24 DIAGNOSIS — R202 Paresthesia of skin: Secondary | ICD-10-CM | POA: Diagnosis not present

## 2019-07-24 DIAGNOSIS — R519 Headache, unspecified: Secondary | ICD-10-CM | POA: Diagnosis not present

## 2019-07-24 DIAGNOSIS — Z8669 Personal history of other diseases of the nervous system and sense organs: Secondary | ICD-10-CM | POA: Diagnosis not present

## 2019-07-26 ENCOUNTER — Encounter: Payer: Self-pay | Admitting: Family Medicine

## 2019-07-30 NOTE — Telephone Encounter (Signed)
Thanks

## 2019-08-08 NOTE — Telephone Encounter (Signed)
Did we do her fmla?

## 2019-08-13 ENCOUNTER — Ambulatory Visit (INDEPENDENT_AMBULATORY_CARE_PROVIDER_SITE_OTHER): Payer: BC Managed Care – PPO | Admitting: Family Medicine

## 2019-08-13 ENCOUNTER — Encounter: Payer: Self-pay | Admitting: Family Medicine

## 2019-08-13 ENCOUNTER — Other Ambulatory Visit: Payer: Self-pay

## 2019-08-13 VITALS — HR 110 | Ht 64.0 in | Wt 155.0 lb

## 2019-08-13 DIAGNOSIS — G43809 Other migraine, not intractable, without status migrainosus: Secondary | ICD-10-CM

## 2019-08-13 DIAGNOSIS — R42 Dizziness and giddiness: Secondary | ICD-10-CM | POA: Diagnosis not present

## 2019-08-13 MED ORDER — TOPIRAMATE 25 MG PO TABS: ORAL_TABLET | ORAL | 0 refills | Status: DC

## 2019-08-13 NOTE — Progress Notes (Signed)
Patient ID: Dawn Gray, female    DOB: 1975-11-02  Age: 44 y.o. MRN: [id number]    Subjective:  Subjective mig HPI Dawn Gray presents for f/u dizziness and migraine.  Pt needs fmla paperwork filled out and referral for neuro.   She went to er last month and it was recommended that she see neuro ----  Migraines are more severe and more frequent ---- she gets headaches 2-3 x a week   Review of Systems  Constitutional: Negative for activity change, appetite change, fatigue, fever and unexpected weight change.  HENT: Negative for congestion.   Respiratory: Negative for cough and shortness of breath.   Cardiovascular: Negative for chest pain, palpitations and leg swelling.  Gastrointestinal: Negative for vomiting.  Musculoskeletal: Negative for back pain.  Skin: Negative for rash.  Neurological: Negative for headaches.  Psychiatric/Behavioral: Negative for behavioral problems and dysphoric mood. The patient is not nervous/anxious.     History Past Medical History:  Diagnosis Date  . Anemia   . Bronchitis    Hx of Chronic  . Cat allergies   . Environmental allergies    seasonal  . GERD (gastroesophageal reflux disease)   . 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 Migraine   . Panic attack     She has a past surgical history that includes Tumor removal (01/2009).   Her family history includes Arthritis in her mother and paternal grandmother; Cancer in her maternal grandfather; Diabetes in her father, mother, and paternal grandmother; Berenice Primas' disease in her father; Heart disease in her maternal uncle; Hyperlipidemia in her father, maternal grandfather, paternal grandfather, and paternal grandmother; Hypertension in her father and mother; Lung cancer in her paternal grandmother; Stroke in her maternal grandmother and paternal grandmother.She reports that she has never smoked. She has never used smokeless tobacco. She reports  current alcohol use. She reports that she does not use drugs.  Current Outpatient Medications on File Prior to Visit  Medication Sig Dispense Refill  . ALPRAZolam (XANAX) 0.25 MG tablet Take 1 tablet (0.25 mg total) by mouth 2 (two) times daily as needed for anxiety. 30 tablet 0  . escitalopram (LEXAPRO) 20 MG tablet Take 1 tablet (20 mg total) by mouth daily. 30 tablet 2  . flintstones complete (FLINTSTONES) 60 MG chewable tablet Chew 1 tablet by mouth 2 (two) times daily.     Current Facility-Administered Medications on File Prior to Visit  Medication Dose Route Frequency Provider Last Rate Last Admin  . PARAGARD INTRAUTERINE COPPER IUD   Intrauterine Once Guss Bunde, MD         Objective:  Objective  Physical Exam Vitals and nursing note reviewed.  Constitutional:      Appearance: She is well-developed.  HENT:     Head: Normocephalic and atraumatic.  Eyes:     Conjunctiva/sclera: Conjunctivae normal.  Neck:     Thyroid: No thyromegaly.     Vascular: No carotid bruit or JVD.  Cardiovascular:     Rate and Rhythm: Normal rate and regular rhythm.     Heart sounds: Normal heart sounds. No murmur.  Pulmonary:     Effort: Pulmonary effort is normal. No respiratory distress.     Breath sounds: Normal breath sounds. No wheezing or rales.  Chest:     Chest wall: No tenderness.  Musculoskeletal:     Cervical back: Normal range of motion and neck supple.  Neurological:     Mental Status: She is alert and oriented to person, place, and time.    Pulse (!) 110   Ht 5\' 4"  (1.626 m)   Wt 155 lb (70.3 kg)   BMI 26.61 kg/m  Wt Readings from Last 3 Encounters:  08/13/19 155 lb (70.3 kg)  03/22/19 165 lb 6.4 oz (75 kg)  07/21/17 162 lb 12.8 oz (73.8 kg)     Lab Results  Component Value Date   WBC 7.9 03/22/2019   HGB 12.0 03/22/2019   HCT 37.5 03/22/2019   PLT 266.0 03/22/2019   GLUCOSE 71 03/22/2019   CHOL 198 03/22/2019   TRIG 82.0 03/22/2019   HDL 57.30 03/22/2019     LDLCALC 124 (H) 03/22/2019   ALT 10 03/22/2019   AST 11 03/22/2019   NA 136 03/22/2019   K 4.1 03/22/2019   CL 101 03/22/2019   CREATININE 0.73 03/22/2019   BUN 11 03/22/2019   CO2 27 03/22/2019   TSH 2.29 03/22/2019   INR 0.93 06/24/2011   HGBA1C 5.9 03/22/2019    No results found.   Assessment & Plan:  Plan  I am having Dawn Gray start on topiramate. I am also having her maintain her flintstones complete, escitalopram, and ALPRAZolam. We will continue to administer paragard intrauterine copper.  Meds ordered this encounter  Medications  . topiramate (TOPAMAX) 25 MG tablet    Sig: 1 po qhs x 1 week then 1 po bid x 1 week then 1 po q am and 2 po qpm    Dispense:  42 tablet    Refill:  0    Problem List Items Addressed This Visit    None    Visit Diagnoses    Other migraine without status migrainosus, not intractable    -  Primary   Relevant Medications   topiramate (TOPAMAX) 25 MG tablet   Other Relevant Orders   Ambulatory referral to Neurology   Dizziness       Relevant Orders   Ambulatory referral to Neurology    referral placed topamax started F/u 3-4 weeks or sooner prn  fmla filled out   Follow-up: No follow-ups on file.  Ann Held, DO

## 2019-10-17 DIAGNOSIS — R202 Paresthesia of skin: Secondary | ICD-10-CM | POA: Diagnosis not present

## 2019-10-17 DIAGNOSIS — R519 Headache, unspecified: Secondary | ICD-10-CM | POA: Diagnosis not present

## 2020-01-23 ENCOUNTER — Telehealth (INDEPENDENT_AMBULATORY_CARE_PROVIDER_SITE_OTHER): Payer: BC Managed Care – PPO | Admitting: Family Medicine

## 2020-01-23 ENCOUNTER — Other Ambulatory Visit: Payer: Self-pay

## 2020-01-23 ENCOUNTER — Encounter: Payer: Self-pay | Admitting: Family Medicine

## 2020-01-23 DIAGNOSIS — F419 Anxiety disorder, unspecified: Secondary | ICD-10-CM

## 2020-01-23 MED ORDER — ESCITALOPRAM OXALATE 20 MG PO TABS: 20.0000 mg | ORAL_TABLET | Freq: Every day | ORAL | 3 refills | Status: DC

## 2020-01-23 MED ORDER — ALPRAZOLAM 0.25 MG PO TABS: 0.2500 mg | ORAL_TABLET | Freq: Two times a day (BID) | ORAL | 0 refills | Status: DC | PRN

## 2020-01-23 NOTE — Progress Notes (Signed)
Virtual Visit via Video Note  I connected with Dawn Gray on 01/23/20 at  9:20 AM EDT by a video enabled telemedicine application and verified that I am speaking with the correct person using two identifiers.  Location: Patient:work alone  Provider: home    I discussed the limitations of evaluation and management by telemedicine and the availability of in person appointments. The patient expressed understanding and agreed to proceed.  History of Present Illness: Pt is home struggling with a lot of death in the family.  Her sister died suddenly and she lost her cousin and aunt and her dad was dx with cancer and she will have to go to [place] to be with him and go to oncologist with him   she will need to be in [place] by the 24th ---- Observations/Objective: There were no vitals filed for this visit. There is no height or weight on file to calculate BMI. Pt is sad --- not suicidal Very stressed out  Unable to concentrate / focus at job   Assessment and Plan: 1. Anxiety Restart lexapro and con't xanax Pt will be out of work 2 months--- to grieve and be there for her dad-- she will get fmla paperwork and bring it to the office  - escitalopram (LEXAPRO) 20 MG tablet; Take 1 tablet (20 mg total) by mouth daily.  Dispense: 90 tablet; Refill: 3 - ALPRAZolam (XANAX) 0.25 MG tablet; Take 1 tablet (0.25 mg total) by mouth 2 (two) times daily as needed for anxiety.  Dispense: 30 tablet; Refill: 0   Follow Up Instructions:    I discussed the assessment and treatment plan with the patient. The patient was provided an opportunity to ask questions and all were answered. The patient agreed with the plan and demonstrated an understanding of the instructions.   The patient was advised to call back or seek an in-person evaluation if the symptoms worsen or if the condition fails to improve as anticipated.  I provided 30 minutes of non-face-to-face time during this encounter.   Ann Held, DO

## 2020-01-28 ENCOUNTER — Telehealth: Payer: Self-pay | Admitting: Family Medicine

## 2020-01-28 NOTE — Telephone Encounter (Signed)
Per patient, her employer [employer] will send over [place].

## 2020-01-28 NOTE — Telephone Encounter (Signed)
Noted  

## 2020-02-12 NOTE — Telephone Encounter (Signed)
Patient called back and stated that her employer still haven't received [place] and is requesting a call back. Patient stated that paperwork is due today, please advise. CB is [phone]

## 2020-02-13 NOTE — Telephone Encounter (Signed)
Left VM. Advised patient to have forms refaxed. Have not received them

## 2020-02-19 NOTE — Telephone Encounter (Signed)
Caller : Taletha  Call Back # [phone]  Patient states requesting paper work , for shot term disability.

## 2020-02-20 NOTE — Telephone Encounter (Signed)
Patient called states paperwork need to be fax to [phone] case Number [id number]. Patient would like a call back

## 2020-02-25 NOTE — Telephone Encounter (Signed)
Patient is calling to check status of forms. Please call patient to confirm that we received them and if they were faxed back.

## 2020-02-26 NOTE — Telephone Encounter (Signed)
Pt called. VM left. We have not received forms yet

## 2020-03-21 ENCOUNTER — Telehealth (INDEPENDENT_AMBULATORY_CARE_PROVIDER_SITE_OTHER): Payer: BC Managed Care – PPO | Admitting: Family Medicine

## 2020-03-21 ENCOUNTER — Other Ambulatory Visit: Payer: Self-pay

## 2020-03-21 ENCOUNTER — Encounter: Payer: Self-pay | Admitting: Family Medicine

## 2020-03-21 DIAGNOSIS — F419 Anxiety disorder, unspecified: Secondary | ICD-10-CM | POA: Diagnosis not present

## 2020-03-21 NOTE — Progress Notes (Signed)
Virtual Visit via Video Note  I connected with Dawn Gray on 03/21/20 at  4:00 PM EDT by a video enabled telemedicine application and verified that I am speaking with the correct person using two identifiers.  Location: Patient: with father in [place]  Provider: office    I discussed the limitations of evaluation and management by telemedicine and the availability of in person appointments. The patient expressed understanding and agreed to proceed.  History of Present Illness: Pt is in [place] with her father who was dx with covid and had to put off his cancer treatments.  The meds are helping but she needs to stay with her dad    Observations/Objective: Vitals:   03/21/20 1558  BP: Comment: unable to obtain  Pulse: Comment: unable to obtain  Weight: 155 lb (70.3 kg)  SpO2: Comment: unable to obtain  pt is in nad She feels the anxiety is improving   Assessment and Plan: 1. Anxiety con't lexapro 20 mg and prn xanax rto 1 month or sooner prn    Follow Up Instructions:    I discussed the assessment and treatment plan with the patient. The patient was provided an opportunity to ask questions and all were answered. The patient agreed with the plan and demonstrated an understanding of the instructions.   The patient was advised to call back or seek an in-person evaluation if the symptoms worsen or if the condition fails to improve as anticipated.  I provided 25 minutes of non-face-to-face time during this encounter.   Ann Held, DO

## 2020-04-10 ENCOUNTER — Telehealth: Payer: Self-pay | Admitting: Family Medicine

## 2020-04-10 NOTE — Telephone Encounter (Signed)
Patient is requesting a note, clinical notes send to her mychart for employer. Patient is currently out of work.

## 2020-04-11 ENCOUNTER — Other Ambulatory Visit: Payer: Self-pay

## 2020-04-11 ENCOUNTER — Encounter: Payer: Self-pay | Admitting: Family Medicine

## 2020-04-11 ENCOUNTER — Telehealth (INDEPENDENT_AMBULATORY_CARE_PROVIDER_SITE_OTHER): Payer: BC Managed Care – PPO | Admitting: Family Medicine

## 2020-04-11 DIAGNOSIS — F418 Other specified anxiety disorders: Secondary | ICD-10-CM

## 2020-04-11 DIAGNOSIS — F419 Anxiety disorder, unspecified: Secondary | ICD-10-CM

## 2020-04-11 MED ORDER — ALPRAZOLAM 0.25 MG PO TABS: 0.2500 mg | ORAL_TABLET | Freq: Two times a day (BID) | ORAL | 0 refills | Status: DC | PRN

## 2020-04-11 NOTE — Progress Notes (Signed)
Virtual Visit via Video Note  I connected with Dawn Gray on 04/11/20 at  3:20 PM EDT by a video enabled telemedicine application and verified that I am speaking with the correct person using two identifiers.  Location: Patient: home with kids Provider: office    I discussed the limitations of evaluation and management by telemedicine and the availability of in person appointments. The patient expressed understanding and agreed to proceed.  History of Present Illness: Pt is home --- she has had more death in the family and is really struggling with caring for her father and going to funerals  She struggles to concentrate / focus on her job and is easily angered     Observations/Objective: There were no vitals filed for this visit. Pt is in nad----  Flat affect,   Seems down She denies suicidal ideation  Doing ok with meds but would like to see counselor again   Assessment and Plan: 1. Depression with anxiety F/u terri  con't lexapro and xanax prn  - Ambulatory referral to Psychology  2. Anxiety Stable  Referral to counselor  - ALPRAZolam (XANAX) 0.25 MG tablet; Take 1 tablet (0.25 mg total) by mouth 2 (two) times daily as needed for anxiety.  Dispense: 30 tablet; Refill: 0   Follow Up Instructions:    I discussed the assessment and treatment plan with the patient. The patient was provided an opportunity to ask questions and all were answered. The patient agreed with the plan and demonstrated an understanding of the instructions.   The patient was advised to call back or seek an in-person evaluation if the symptoms worsen or if the condition fails to improve as anticipated.  I provided 25 minutes of non-face-to-face time during this encounter.   Ann Held, DO

## 2020-04-11 NOTE — Telephone Encounter (Signed)
Spoke with patient. Pt needs [employer] forms filled out with office notes sent. Forms have been received.

## 2020-04-11 NOTE — Telephone Encounter (Signed)
Spoke with patient. Pt has virtual today

## 2020-04-11 NOTE — Telephone Encounter (Signed)
I thought the fmla was for taking care of her father--- we received mental health forms --- normally psych fills them out

## 2020-04-18 DIAGNOSIS — R202 Paresthesia of skin: Secondary | ICD-10-CM | POA: Diagnosis not present

## 2020-04-18 DIAGNOSIS — R519 Headache, unspecified: Secondary | ICD-10-CM | POA: Diagnosis not present

## 2020-04-23 NOTE — Telephone Encounter (Signed)
Patient states her employer has not received paperwork. Patient states she would like a copy of the forms along with the doctor's note loaded to my chart.

## 2020-05-01 NOTE — Telephone Encounter (Signed)
FMLA forms printed and last two office notes faxed.

## 2020-05-01 NOTE — Telephone Encounter (Signed)
Patient states she need office notes from Dr Etter Sjogren for office visit 03/21/2020/04/11/2020. Patient would like them sent to her mychart.

## 2020-05-12 ENCOUNTER — Encounter: Payer: Self-pay | Admitting: Family Medicine

## 2020-05-12 ENCOUNTER — Telehealth (INDEPENDENT_AMBULATORY_CARE_PROVIDER_SITE_OTHER): Payer: BC Managed Care – PPO | Admitting: Family Medicine

## 2020-05-12 ENCOUNTER — Other Ambulatory Visit: Payer: Self-pay

## 2020-05-12 DIAGNOSIS — G4700 Insomnia, unspecified: Secondary | ICD-10-CM | POA: Diagnosis not present

## 2020-05-12 DIAGNOSIS — F418 Other specified anxiety disorders: Secondary | ICD-10-CM

## 2020-05-12 MED ORDER — AMITRIPTYLINE HCL 10 MG PO TABS: 10.0000 mg | ORAL_TABLET | Freq: Every day | ORAL | 2 refills | Status: DC

## 2020-05-12 NOTE — Progress Notes (Signed)
Virtual Visit via Video Note  I connected with Dawn Gray on 05/12/20 at  1:40 PM EST by a video enabled telemedicine application and verified that I am speaking with the correct person using two identifiers.  Location: Patient: home with kids  Provider: office-- had to transition to phone call due to camera and sound problems    I discussed the limitations of evaluation and management by telemedicine and the availability of in person appointments. The patient expressed understanding and agreed to proceed.  History of Present Illness: Pt is home f/u anxiety problems --- meds helping some but pt still struggling  Her counseling app is not until next month    Observations/Objective: There were no vitals filed for this visit. Pt is in nad  Not suicidal   Assessment and Plan: 1. Insomnia, unspecified type Try taking 20 mg and f/u neuro  - amitriptyline (ELAVIL) 10 MG tablet; Take 1 tablet (10 mg total) by mouth at bedtime.  Dispense: 60 tablet; Refill: 2  2. Depression with anxiety con't celexa -- xanax prn  Counseling to start next month F/u 1 month   Follow Up Instructions:    I discussed the assessment and treatment plan with the patient. The patient was provided an opportunity to ask questions and all were answered. The patient agreed with the plan and demonstrated an understanding of the instructions.   The patient was advised to call back or seek an in-person evaluation if the symptoms worsen or if the condition fails to improve as anticipated.  I provided 25 minutes of non-face-to-face time during this encounter.   Ann Held, DO

## 2020-05-12 NOTE — Telephone Encounter (Signed)
Per Dr. Etter Sjogren, [place] states job never received forms. I have faxed forms and office notes again.

## 2020-05-12 NOTE — Assessment & Plan Note (Signed)
Slightly better but first counseling session is not until next month fmla was written out until Jan 2022 Keep counseling app con't meds

## 2020-06-02 ENCOUNTER — Ambulatory Visit (INDEPENDENT_AMBULATORY_CARE_PROVIDER_SITE_OTHER): Payer: BC Managed Care – PPO | Admitting: Professional

## 2020-06-02 DIAGNOSIS — F332 Major depressive disorder, recurrent severe without psychotic features: Secondary | ICD-10-CM

## 2020-06-03 ENCOUNTER — Telehealth (HOSPITAL_COMMUNITY): Payer: Self-pay | Admitting: Psychiatry

## 2020-06-03 NOTE — Telephone Encounter (Signed)
D:  Dawn Gray, [facility] referred pt to [place].  A:  Placed call to orient pt.  Patient states she is interested but this week wouldn't be a good week to start.  Attempted to get pt to go ahead and start and she could be excused a day or two since she has plans already, but pt declined.  Case manager is off next week; therefore pt will start on Mon. 06-16-20 @ 9 a.m.  Encouraged pt to verify her benefits. Inform Juliann Pulse.   R:  Pt receptive.

## 2020-06-04 ENCOUNTER — Other Ambulatory Visit: Payer: Self-pay

## 2020-06-04 ENCOUNTER — Other Ambulatory Visit (HOSPITAL_COMMUNITY): Payer: BC Managed Care – PPO | Attending: Psychiatry | Admitting: Psychiatry
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
## 2020-06-12 ENCOUNTER — Other Ambulatory Visit: Payer: Self-pay

## 2020-06-16 ENCOUNTER — Other Ambulatory Visit: Payer: Self-pay

## 2020-06-16 ENCOUNTER — Telehealth (INDEPENDENT_AMBULATORY_CARE_PROVIDER_SITE_OTHER): Payer: BC Managed Care – PPO | Admitting: Family Medicine

## 2020-06-16 ENCOUNTER — Encounter (HOSPITAL_COMMUNITY): Payer: Self-pay | Admitting: Psychiatry

## 2020-06-16 ENCOUNTER — Other Ambulatory Visit (HOSPITAL_COMMUNITY): Payer: BC Managed Care – PPO | Attending: Psychiatry | Admitting: Licensed Clinical Social Worker

## 2020-06-16 ENCOUNTER — Encounter: Payer: Self-pay | Admitting: Family Medicine

## 2020-06-16 DIAGNOSIS — Z79899 Other long term (current) drug therapy: Secondary | ICD-10-CM | POA: Diagnosis not present

## 2020-06-16 DIAGNOSIS — F418 Other specified anxiety disorders: Secondary | ICD-10-CM

## 2020-06-16 DIAGNOSIS — Z635 Disruption of family by separation and divorce: Secondary | ICD-10-CM | POA: Insufficient documentation

## 2020-06-16 DIAGNOSIS — Z634 Disappearance and death of family member: Secondary | ICD-10-CM | POA: Insufficient documentation

## 2020-06-16 DIAGNOSIS — F419 Anxiety disorder, unspecified: Secondary | ICD-10-CM | POA: Diagnosis not present

## 2020-06-16 DIAGNOSIS — F329 Major depressive disorder, single episode, unspecified: Secondary | ICD-10-CM | POA: Diagnosis not present

## 2020-06-16 DIAGNOSIS — F411 Generalized anxiety disorder: Secondary | ICD-10-CM | POA: Diagnosis not present

## 2020-06-16 DIAGNOSIS — G4700 Insomnia, unspecified: Secondary | ICD-10-CM

## 2020-06-16 MED ORDER — ALPRAZOLAM 0.25 MG PO TABS: 0.2500 mg | ORAL_TABLET | Freq: Two times a day (BID) | ORAL | 2 refills | Status: DC | PRN

## 2020-06-16 MED ORDER — AMITRIPTYLINE HCL 10 MG PO TABS: ORAL_TABLET | ORAL | 2 refills | Status: DC

## 2020-06-16 NOTE — Progress Notes (Signed)
Virtual Visit via Video Note  I connected with Dawn Gray on 06/16/20 at 10:00 AM EST by a video enabled telemedicine application and verified that I am speaking with the correct person using two identifiers.  Location/ people involved in visit  Patient: home alone Provider: home    I discussed the limitations of evaluation and management by telemedicine and the availability of in person appointments. The patient expressed understanding and agreed to proceed.  History of Present Illness: Pt is home f/u anxiety / depression.  She saw counselor and today was her first day of intensive therapy/ group therapy.  She is not sure she is going to like group therapy    Observations/Objective: There were no vitals filed for this visit.  Pt is not suicidal --- she is anxious/ depressed but meds are helping     Assessment and Plan: 1. Insomnia, unspecified type 1-2 po qhs prn  - amitriptyline (ELAVIL) 10 MG tablet; 1-2 po qhs prn sleep  Dispense: 60 tablet; Refill: 2  2. Anxiety con't lexapro and prn xanax con't therapy and intensive therapy - ALPRAZolam (XANAX) 0.25 MG tablet; Take 1 tablet (0.25 mg total) by mouth 2 (two) times daily as needed for anxiety.  Dispense: 30 tablet; Refill: 2   Follow Up Instructions:    I discussed the assessment and treatment plan with the patient. The patient was provided an opportunity to ask questions and all were answered. The patient agreed with the plan and demonstrated an understanding of the instructions.   The patient was advised to call back or seek an in-person evaluation if the symptoms worsen or if the condition fails to improve as anticipated.  I provided 25 minutes of non-face-to-face time during this encounter.   Donato Schultz, DO

## 2020-06-17 ENCOUNTER — Other Ambulatory Visit (HOSPITAL_COMMUNITY): Payer: BC Managed Care – PPO | Admitting: Licensed Clinical Social Worker

## 2020-06-17 ENCOUNTER — Other Ambulatory Visit: Payer: Self-pay

## 2020-06-17 DIAGNOSIS — F329 Major depressive disorder, single episode, unspecified: Secondary | ICD-10-CM | POA: Diagnosis not present

## 2020-06-17 DIAGNOSIS — Z634 Disappearance and death of family member: Secondary | ICD-10-CM | POA: Diagnosis not present

## 2020-06-17 DIAGNOSIS — Z79899 Other long term (current) drug therapy: Secondary | ICD-10-CM | POA: Diagnosis not present

## 2020-06-17 DIAGNOSIS — F411 Generalized anxiety disorder: Secondary | ICD-10-CM | POA: Diagnosis not present

## 2020-06-17 DIAGNOSIS — Z635 Disruption of family by separation and divorce: Secondary | ICD-10-CM | POA: Diagnosis not present

## 2020-06-17 DIAGNOSIS — F418 Other specified anxiety disorders: Secondary | ICD-10-CM

## 2020-06-18 ENCOUNTER — Other Ambulatory Visit: Payer: Self-pay

## 2020-06-18 ENCOUNTER — Other Ambulatory Visit (HOSPITAL_COMMUNITY): Payer: BC Managed Care – PPO | Admitting: Psychiatry

## 2020-06-18 ENCOUNTER — Encounter (HOSPITAL_COMMUNITY): Payer: Self-pay | Admitting: Family

## 2020-06-18 DIAGNOSIS — F329 Major depressive disorder, single episode, unspecified: Secondary | ICD-10-CM | POA: Diagnosis not present

## 2020-06-18 DIAGNOSIS — Z79899 Other long term (current) drug therapy: Secondary | ICD-10-CM | POA: Diagnosis not present

## 2020-06-18 DIAGNOSIS — Z634 Disappearance and death of family member: Secondary | ICD-10-CM | POA: Diagnosis not present

## 2020-06-18 DIAGNOSIS — F332 Major depressive disorder, recurrent severe without psychotic features: Secondary | ICD-10-CM

## 2020-06-18 DIAGNOSIS — Z635 Disruption of family by separation and divorce: Secondary | ICD-10-CM | POA: Diagnosis not present

## 2020-06-18 DIAGNOSIS — F411 Generalized anxiety disorder: Secondary | ICD-10-CM | POA: Diagnosis not present
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
## 2020-06-18 NOTE — Progress Notes (Signed)
Virtual Visit via Video Note  I connected with Dawn Gray on 06/18/20 at  9:00 AM EST by a video enabled telemedicine application and verified that I am speaking with the correct person using two identifiers.  At orientation to the IOP program, Case Manager discussed the limitations of evaluation and management by telemedicine and the availability of in person appointments. The patient expressed understanding and agreed to proceed with virtual visits throughout the duration of the program.   Location:  Patient: Patient Home Provider: Home Office   History of Present Illness:   Observations/Objective: Check In: Case Manager checked in with all participants to review discharge dates, insurance authorizations, work-related documents and needs from the treatment team. Client stated needs and engaged in discussion. Counselor facilitated a group processing with group members to assess mood and current functioning. Client reports that she is in treatment for unresolved grief, depression and anxiety that has been impacting her functioning and ability to work. Client notes that she was unable to fall asleep until 5am and that she had difficulty waking on time for group. Client presents with moderate depression and severe anxiety. Client denied any current SI/HI/psychosis.    Initial Therapeutic Activity: Counselor introduced [place], [place] Chaplain to present information and discussion on Grief and Loss. Group members engaged in discussion, sharing how grief impacts them, what comforts them, what emotions are felt, labeling losses, etc. After guest speaker logged off, Counselor prompted group to spend 10-15 minutes journaling to process personal grief and loss situations. Counselor processed entries with group and client's identified areas for additional processing in individual therapy. Client noted that she has experienced the loss of 13 family members in the past year, expounding on the impact  of the loss of her grandmother and sister as most significant.    Second Therapeutic Activity: Counselor continued the topic of grief and loss, by sharing psychoeducation with group members on the stages of grief and the different types of grief. Group members engaged in discussion and shared how they related to the information, stating what stage of grief they are primarily in, as well as what kind of grief they are experiencing. Client identified with that she is in the denial stage and that she has complicated, compounded and disenfranchised grief.  Check Out: Counselor prompted group members to identify one self-care practice or productivity activity they would like to engage in today. Client plans to take a bath to relax. Client endorsed safety plan to be followed to prevent safety issues.  Assessment and Plan: Clinician recommends that Client remain in IOP treatment to better manage mental health symptoms, stabilization and to address treatment plan goals. Clinician recommends adherence to crisis/safety plan, taking medications as prescribed, and following up with medical professionals if any issues arise.   Follow Up Instructions: Clinician will send Webex link for next session. The Client was advised to call back or seek an in-person evaluation if the symptoms worsen or if the condition fails to improve as anticipated.     I provided 180 minutes of non-face-to-face time during this encounter.     Hilbert Odor, LCSW

## 2020-06-19 ENCOUNTER — Other Ambulatory Visit (HOSPITAL_COMMUNITY): Payer: BC Managed Care – PPO | Admitting: Psychiatry

## 2020-06-19 ENCOUNTER — Other Ambulatory Visit: Payer: Self-pay

## 2020-06-19 ENCOUNTER — Ambulatory Visit (HOSPITAL_COMMUNITY): Payer: BC Managed Care – PPO

## 2020-06-19 ENCOUNTER — Encounter (HOSPITAL_COMMUNITY): Payer: Self-pay | Admitting: Psychiatry

## 2020-06-19 DIAGNOSIS — F332 Major depressive disorder, recurrent severe without psychotic features: Secondary | ICD-10-CM

## 2020-06-19 DIAGNOSIS — F411 Generalized anxiety disorder: Secondary | ICD-10-CM | POA: Diagnosis not present

## 2020-06-19 DIAGNOSIS — Z79899 Other long term (current) drug therapy: Secondary | ICD-10-CM | POA: Diagnosis not present

## 2020-06-19 DIAGNOSIS — Z634 Disappearance and death of family member: Secondary | ICD-10-CM | POA: Diagnosis not present

## 2020-06-19 DIAGNOSIS — F329 Major depressive disorder, single episode, unspecified: Secondary | ICD-10-CM | POA: Diagnosis not present

## 2020-06-19 DIAGNOSIS — Z635 Disruption of family by separation and divorce: Secondary | ICD-10-CM | POA: Diagnosis not present

## 2020-06-19 NOTE — Progress Notes (Signed)
Virtual Visit via Video Note  I connected with Dorcas Carrow on 06/19/20 at  9:00 AM EST by a video enabled telemedicine application and verified that I am speaking with the correct person using two identifiers.  At orientation to the IOP program, Case Manager discussed the limitations of evaluation and management by telemedicine and the availability of in person appointments. The patient expressed understanding and agreed to proceed with virtual visits throughout the duration of the program.   Location:  Patient: Patient Home Provider: Home Office   History of Present Illness: MDD  Observations/Objective: Check In: Case Manager checked in with all participants to review discharge dates, insurance authorizations, work-related documents and needs from the treatment team. Client stated needs and engaged in discussion. Counselor facilitated a group processing with group members to assess mood and current functioning. Client reports that she decided and was able to spend time with a friend and a family member. Client noted that it felt good to open up and talk with them about her mental health treatment and condition. Client shared about a grief trigger and how she applied coping skills to recover.   Client presents with moderate depression and moderate anxiety. Client denied any current SI/HI/psychosis.    Initial Therapeutic Activity: Counselor engaged group in an activity to assess their how their anxiety and depressive symptoms are the same and different. Group members shared their reflections after taking time to journal. Client stated that she has dealt with depression for 25 years and anxiety for 3 years. Client able to connect traumatic events with onset of symptoms.    Second Therapeutic Activity: Counselor introduced a video by Rob Hickman from [employer], who presented information on how to properly use and apply coping skills. Video additionally highlighted 25 coping skills to  combat depression, anxiety and intense feelings. Counselor prompted group to share takeaways, with Client noting that she is going to work at catching her warning signs to apply the coping skills before she gets in the "red zone".   Check Out: Counselor prompted group members to identify one self-care practice or productivity activity they would like to engage in today. Client plans to rest and nap to prepare for more emotional connection with visiting family. Client endorsed safety plan to be followed to prevent safety issues.  Assessment and Plan: Clinician recommends that Client remain in IOP treatment to better manage mental health symptoms, stabilization and to address treatment plan goals. Clinician recommends adherence to crisis/safety plan, taking medications as prescribed, and following up with medical professionals if any issues arise.   Follow Up Instructions: Clinician will send Webex link for next session. The Client was advised to call back or seek an in-person evaluation if the symptoms worsen or if the condition fails to improve as anticipated.     I provided 180 minutes of non-face-to-face time during this encounter.     Hilbert Odor, LCSW

## 2020-06-20 ENCOUNTER — Ambulatory Visit (HOSPITAL_COMMUNITY): Payer: BC Managed Care – PPO

## 2020-06-20 ENCOUNTER — Encounter (HOSPITAL_COMMUNITY): Payer: Self-pay | Admitting: Psychiatry

## 2020-06-20 ENCOUNTER — Other Ambulatory Visit (HOSPITAL_COMMUNITY): Payer: BC Managed Care – PPO | Admitting: Psychiatry

## 2020-06-20 ENCOUNTER — Other Ambulatory Visit: Payer: Self-pay

## 2020-06-20 DIAGNOSIS — Z79899 Other long term (current) drug therapy: Secondary | ICD-10-CM | POA: Diagnosis not present

## 2020-06-20 DIAGNOSIS — F332 Major depressive disorder, recurrent severe without psychotic features: Secondary | ICD-10-CM

## 2020-06-20 DIAGNOSIS — Z635 Disruption of family by separation and divorce: Secondary | ICD-10-CM | POA: Diagnosis not present

## 2020-06-20 DIAGNOSIS — Z634 Disappearance and death of family member: Secondary | ICD-10-CM | POA: Diagnosis not present

## 2020-06-20 DIAGNOSIS — F329 Major depressive disorder, single episode, unspecified: Secondary | ICD-10-CM | POA: Diagnosis not present

## 2020-06-20 DIAGNOSIS — F411 Generalized anxiety disorder: Secondary | ICD-10-CM | POA: Diagnosis not present

## 2020-06-20 NOTE — Progress Notes (Signed)
Virtual Visit via Video Note  I connected with [name] on 06/20/20 at  9:00 AM EST by a video enabled telemedicine application and verified that I am speaking with the correct person using two identifiers.  At orientation to the IOP program, Case Manager discussed the limitations of evaluation and management by telemedicine and the availability of in person appointments. The patient expressed understanding and agreed to proceed with virtual visits throughout the duration of the program.   Location:  Patient: Patient Home Provider: Home Office   History of Present Illness: MDD  Observations/Objective: Check In: Case Manager checked in with all participants to review discharge dates, insurance authorizations, work-related documents and needs from the treatment team. Client stated needs and engaged in discussion. Counselor facilitated a group processing with group members to assess mood and current functioning. Client reports that she made time to relax yesterday. Client shared about coping skills used to deal with a grief reaction and how she was able to offer support to her friend also experiencing grief. Client presents with moderate depression and moderate anxiety. Client denied any current SI/HI/psychosis.    Initial Therapeutic Activity: Counselor shared psychoeducation about how our body experiences different emotions based on scanning and imaging done in a research study reported on by [facility]. Counselor allowed time for discussion and connection between what they group members experience in their bodies, compared to the images.   Second Therapeutic Activity: Counselor presented the Cycle of Depression and the Cycle of Anxiety to the Group, connecting their patterns in problematic behaviors to the experiences they documented yesterday with their anxiety and depressive symptoms. Group engaged in discussion around habits, behaviors, negative coping and alternative ways to combat anxiety  and depression.  Check Out: Counselor prompted group members to identify one self-care practice or productivity activity they would like to engage in over the weekend. Client plans to do an art activity with friends and "get out of the house". Client endorsed safety plan to be followed to prevent safety issues.  Assessment and Plan: Clinician recommends that Client remain in IOP treatment to better manage mental health symptoms, stabilization and to address treatment plan goals. Clinician recommends adherence to crisis/safety plan, taking medications as prescribed, and following up with medical professionals if any issues arise.   Follow Up Instructions: Clinician will send Webex link for next session. The Client was advised to call back or seek an in-person evaluation if the symptoms worsen or if the condition fails to improve as anticipated.     I provided 180 minutes of non-face-to-face time during this encounter.     Lise Auer, LCSW

## 2020-06-23 ENCOUNTER — Encounter (HOSPITAL_COMMUNITY): Payer: Self-pay

## 2020-06-23 ENCOUNTER — Other Ambulatory Visit (HOSPITAL_COMMUNITY): Payer: BC Managed Care – PPO | Admitting: Psychiatry

## 2020-06-23 ENCOUNTER — Other Ambulatory Visit: Payer: Self-pay

## 2020-06-23 DIAGNOSIS — Z79899 Other long term (current) drug therapy: Secondary | ICD-10-CM | POA: Diagnosis not present

## 2020-06-23 DIAGNOSIS — Z634 Disappearance and death of family member: Secondary | ICD-10-CM | POA: Diagnosis not present

## 2020-06-23 DIAGNOSIS — F411 Generalized anxiety disorder: Secondary | ICD-10-CM | POA: Diagnosis not present

## 2020-06-23 DIAGNOSIS — F329 Major depressive disorder, single episode, unspecified: Secondary | ICD-10-CM | POA: Diagnosis not present

## 2020-06-23 DIAGNOSIS — F332 Major depressive disorder, recurrent severe without psychotic features: Secondary | ICD-10-CM

## 2020-06-23 DIAGNOSIS — Z635 Disruption of family by separation and divorce: Secondary | ICD-10-CM | POA: Diagnosis not present

## 2020-06-23 NOTE — Progress Notes (Signed)
Virtual Visit via Video Note  I connected with [name] on 06/23/20 at  9:00 AM EST by a video enabled telemedicine application and verified that I am speaking with the correct person using two identifiers.  At orientation to the IOP program, Case Manager discussed the limitations of evaluation and management by telemedicine and the availability of in person appointments. The patient expressed understanding and agreed to proceed with virtual visits throughout the duration of the program.   Location:  Patient: Patient Home Provider: Home Office   History of Present Illness: MDD  Observations/Objective: Check In: Case Manager checked in with all participants to review discharge dates, insurance authorizations, work-related documents and needs from the treatment team. Client stated needs and engaged in discussion. Case Manager introduced new group members and allowed group to welcome and start the joining process.    Initial Therapeutic Activity: Counselor facilitated a group processing with group members to assess mood and current functioning. Client reports being in "good spirits", having a healing and enjoyable weekend with a friend and family member. 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 Client presents with moderate depression and moderate anxiety. Client denied any current SI/HI/psychosis.  Second Therapeutic Activity: Counselor provided group with psychoeducation about the application and benefits of Mindfulness. Counselor engaged group in discussion about baseline understanding of mindfulness. Counselor reviewed an article and handout that outlined definition, facts and mindfulness exercises. Group engaged in the practices as Counselor guided a meditation, body scan and mindful eating. Client noted that she would like to try the mindful eating practices on her own. She plans to use practices to become more self-aware in mindfulness  work.  Check Out: Counselor prompted group members to identify one self-care practice or productivity activity they would like to engage in today. Client plans to rest and clean her home. Client endorsed safety plan to be followed to prevent safety issues.  Assessment and Plan: Clinician recommends that Client remain in IOP treatment to better manage mental health symptoms, stabilization and to address treatment plan goals. Clinician recommends adherence to crisis/safety plan, taking medications as prescribed, and following up with medical professionals if any issues arise.   Follow Up Instructions: Clinician will send Webex link for next session. The Client was advised to call back or seek an in-person evaluation if the symptoms worsen or if the condition fails to improve as anticipated.     I provided 180 minutes of non-face-to-face time during this encounter.     Lise Auer, LCSW

## 2020-06-24 ENCOUNTER — Other Ambulatory Visit (HOSPITAL_COMMUNITY): Payer: BC Managed Care – PPO | Admitting: Psychiatry

## 2020-06-24 ENCOUNTER — Encounter (HOSPITAL_COMMUNITY): Payer: Self-pay

## 2020-06-24 ENCOUNTER — Other Ambulatory Visit: Payer: Self-pay | Admitting: Family Medicine

## 2020-06-24 ENCOUNTER — Other Ambulatory Visit: Payer: Self-pay

## 2020-06-24 DIAGNOSIS — Z635 Disruption of family by separation and divorce: Secondary | ICD-10-CM | POA: Diagnosis not present

## 2020-06-24 DIAGNOSIS — F329 Major depressive disorder, single episode, unspecified: Secondary | ICD-10-CM | POA: Diagnosis not present

## 2020-06-24 DIAGNOSIS — Z634 Disappearance and death of family member: Secondary | ICD-10-CM | POA: Diagnosis not present

## 2020-06-24 DIAGNOSIS — F411 Generalized anxiety disorder: Secondary | ICD-10-CM | POA: Diagnosis not present

## 2020-06-24 DIAGNOSIS — F419 Anxiety disorder, unspecified: Secondary | ICD-10-CM

## 2020-06-24 DIAGNOSIS — Z79899 Other long term (current) drug therapy: Secondary | ICD-10-CM | POA: Diagnosis not present

## 2020-06-24 DIAGNOSIS — F332 Major depressive disorder, recurrent severe without psychotic features: Secondary | ICD-10-CM

## 2020-06-24 NOTE — Telephone Encounter (Signed)
Requesting:Alprazolam Contract:03/22/2019 UDS: 03/22/2019 Last Visit: 06/16/2020 Next Visit: none scheduled with pcp  Last Refill:06/16/2020  Please Advise

## 2020-06-24 NOTE — Progress Notes (Signed)
Virtual Visit via Video Note  I connected with [name] on 06/24/20 at  9:00 AM EST by a video enabled telemedicine application and verified that I am speaking with the correct person using two identifiers.  At orientation to the IOP program, Case Manager discussed the limitations of evaluation and management by telemedicine and the availability of in person appointments. The patient expressed understanding and agreed to proceed with virtual visits throughout the duration of the program.   Location:  Patient: Patient Home Provider: Home Office   History of Present Illness: MDD  Observations/Objective: Check In: Case Manager checked in with all participants to review discharge dates, insurance authorizations, work-related documents and needs from the treatment team. Client stated needs and engaged in discussion. Case Manager introduced new group member and allowed group to welcome and start the joining process.    Initial Therapeutic Activity: Counselor facilitated a group processing with group members to assess mood and current functioning. Client reports that she feeling tired and is anticipating a grief response when her friends and family leave to return to her hometown. Client notes that group has been very helpful for her in processing emotional and life stressors. Client presents with moderate depression and mild anxiety. Client denied any current SI/HI/psychosis.  Second Therapeutic Activity: Counselor provided group with psychoeducation about Self-esteem and Self-love. Counselor facilitated a self-esteem assessment, assisting with understanding and processing of overall scores. Client scored 29 out of 40, stating that her self-esteem today is less than normal. Counselor provided group with instructions and worksheet on writing a love letter to themselves.  Client identified that she does want to reconnect with her inner strengths of being courageous, resourceful and knowledgable.  Counselor provided additional resources to work on outside of group in developing their self-esteem and application of self-love practices.   Check Out: Counselor prompted group members to identify one self-care practice or productivity activity they would like to engage in today. Client plans to rest and reconnect with family, as well as clean her home. Client endorsed safety plan to be followed to prevent safety issues.  Assessment and Plan: Clinician recommends that Client remain in IOP treatment to better manage mental health symptoms, stabilization and to address treatment plan goals. Clinician recommends adherence to crisis/safety plan, taking medications as prescribed, and following up with medical professionals if any issues arise.   Follow Up Instructions: Clinician will send Webex link for next session. The Client was advised to call back or seek an in-person evaluation if the symptoms worsen or if the condition fails to improve as anticipated.     I provided 180 minutes of non-face-to-face time during this encounter.     Lise Auer, LCSW

## 2020-06-25 ENCOUNTER — Encounter (HOSPITAL_COMMUNITY): Payer: Self-pay

## 2020-06-25 ENCOUNTER — Telehealth: Payer: Self-pay | Admitting: Family Medicine

## 2020-06-25 ENCOUNTER — Other Ambulatory Visit: Payer: Self-pay

## 2020-06-25 ENCOUNTER — Other Ambulatory Visit (HOSPITAL_COMMUNITY): Payer: BC Managed Care – PPO | Admitting: Psychiatry

## 2020-06-25 DIAGNOSIS — Z635 Disruption of family by separation and divorce: Secondary | ICD-10-CM | POA: Diagnosis not present

## 2020-06-25 DIAGNOSIS — Z79899 Other long term (current) drug therapy: Secondary | ICD-10-CM | POA: Diagnosis not present

## 2020-06-25 DIAGNOSIS — F332 Major depressive disorder, recurrent severe without psychotic features: Secondary | ICD-10-CM

## 2020-06-25 DIAGNOSIS — Z634 Disappearance and death of family member: Secondary | ICD-10-CM | POA: Diagnosis not present

## 2020-06-25 DIAGNOSIS — F329 Major depressive disorder, single episode, unspecified: Secondary | ICD-10-CM | POA: Diagnosis not present

## 2020-06-25 DIAGNOSIS — F411 Generalized anxiety disorder: Secondary | ICD-10-CM | POA: Diagnosis not present

## 2020-06-25 NOTE — Telephone Encounter (Signed)
Caller: Enid Derry from Dr. Encarnacion Chu office Dr.[address] direct line [phone]  Dr. Encarnacion Chu is conducting a psychological evaluation on patient and would like to discuss Psychological history with you.

## 2020-06-25 NOTE — Progress Notes (Addendum)
Virtual Visit via Video Note  I connected with [name] on 06/25/20 at  9:00 AM EST by a video enabled telemedicine application and verified that I am speaking with the correct person using two identifiers.  At orientation to the IOP program, Case Manager discussed the limitations of evaluation and management by telemedicine and the availability of in person appointments. The patient expressed understanding and agreed to proceed with virtual visits throughout the duration of the program.   Location:  Patient: Patient Home Provider: Home Office   History of Present Illness: MDD  Observations/Objective: Check In: Case Manager checked in with all participants to review discharge dates, insurance authorizations, work-related documents and needs from the treatment team. Client stated needs and engaged in discussion. Client presents with moderate depression and moderate anxiety. Client denied any current SI/HI/psychosis.    Initial Therapeutic Activity: Counselor introduced our guest speaker, Einar Grad, [facility] Pharmacist, who shared about psychiatric medications, side effects, treatment considerations and how to communicate with medical professionals. Group Members asked questions and shared medication concerns. Client to start tracking symptoms and will discuss med concerns with provider, as she values a holistic approach Counselor encouraged routine medical check-ups, preparing for appointments, following up with recommendations and seeking specialist if needed.   Second Therapeutic Activity: Counselor introduced [place], [place] Chaplain to present information and discussion on Grief and Loss. Group members engaged in discussion, sharing how grief impacts them, what comforts them, what emotions are felt, labeling losses, etc. After guest speaker logged off, Counselor prompted group to spend 10-15 minutes journaling to process personal grief and loss situations. Counselor processed entries  with group and client's identified areas for additional processing in individual therapy. Client noted the loss of 20 family members over the past 3 years, being in denial and avoiding processing feelings, "because it is too much." Client notes that over the past week she is allowing herself more time to do grief work.   Check Out: Client endorsed safety plan to be followed to prevent safety issues. Counselor shared about plan for next session and discussed ways to communicate needs that may arise before tomorrow.   Assessment and Plan: Clinician recommends that Client remain in IOP treatment to better manage mental health symptoms, stabilization and to address treatment plan goals. Clinician recommends adherence to crisis/safety plan, taking medications as prescribed, and following up with medical professionals if any issues arise.   Follow Up Instructions: Clinician will send Webex link for next session. The Client was advised to call back or seek an in-person evaluation if the symptoms worsen or if the condition fails to improve as anticipated.     I provided 180 minutes of non-face-to-face time during this encounter.     Lise Auer, LCSW

## 2020-06-25 NOTE — Telephone Encounter (Signed)
Left message  Pt seeing psychology and has been seeing them very regularly-- rec to remain in IOP

## 2020-06-26 ENCOUNTER — Other Ambulatory Visit: Payer: Self-pay

## 2020-06-26 ENCOUNTER — Other Ambulatory Visit (HOSPITAL_COMMUNITY): Payer: BC Managed Care – PPO | Admitting: Psychiatry

## 2020-06-26 ENCOUNTER — Encounter (HOSPITAL_COMMUNITY): Payer: Self-pay

## 2020-06-26 DIAGNOSIS — Z635 Disruption of family by separation and divorce: Secondary | ICD-10-CM | POA: Diagnosis not present

## 2020-06-26 DIAGNOSIS — F411 Generalized anxiety disorder: Secondary | ICD-10-CM | POA: Diagnosis not present

## 2020-06-26 DIAGNOSIS — F329 Major depressive disorder, single episode, unspecified: Secondary | ICD-10-CM | POA: Diagnosis not present

## 2020-06-26 DIAGNOSIS — F332 Major depressive disorder, recurrent severe without psychotic features: Secondary | ICD-10-CM

## 2020-06-26 DIAGNOSIS — Z79899 Other long term (current) drug therapy: Secondary | ICD-10-CM | POA: Diagnosis not present

## 2020-06-26 DIAGNOSIS — Z634 Disappearance and death of family member: Secondary | ICD-10-CM | POA: Diagnosis not present

## 2020-06-26 NOTE — Progress Notes (Signed)
Virtual Visit via Video Note  I connected with [name] on 06/26/20 at  9:00 AM EST by a video enabled telemedicine application and verified that I am speaking with the correct person using two identifiers.  At orientation to the IOP program, Case Manager discussed the limitations of evaluation and management by telemedicine and the availability of in person appointments. The patient expressed understanding and agreed to proceed with virtual visits throughout the duration of the program.   Location:  Patient: Patient Home Provider: Home Office   History of Present Illness: MDD  Observations/Objective: Check In: Case Manager checked in with all participants to review discharge dates, insurance authorizations, work-related documents and needs from the treatment team. Client stated needs and engaged in discussion. Counselor facilitated a group processing with group members to assess mood and current functioning. Client reports that she decided to cope with grief and loss triggers by clean and reorganizing her home. Client reports that she was able to do some future planning to reduce anxiety. Client feeling mentally and emotionally tired today, was up most of the night with daughter who was not feeling well. Client presents with moderate depression and moderate anxiety. Client denied any current SI/HI/psychosis.    Initial Therapeutic Activity: Counselor introduced Genoveva Ill, representative with [employer] to share about programming. Group Members asked questions and engaged in discussion, as Cristie Hem shared about Peer Support, Support Groups and the [employer]. Client stated that they are interested in connecting with the [employer] Women[facility] Support Group and Grief and Loss Classes.   Second Therapeutic Activity: Counselor reviewed Self-love practices with the group and shared an additional at home project they can do to make the practice more tangible, called "Catch Yourself  Doing Good". Counselor prompted group members to share how they are applying skills and any changes in how they view themselves, since we originally covered this topic. Client shared that she is finding more internal motivation to address her needs and to problem solve, in order to reconnect with self.  Check Out: Counselor prompted group members to identify one self-care practice or productivity activity they would like to engage in today. Client plans to take a nap and rest. Client endorsed safety plan to be followed to prevent safety issues.  Assessment and Plan: Clinician recommends that Client remain in IOP treatment to better manage mental health symptoms, stabilization and to address treatment plan goals. Clinician recommends adherence to crisis/safety plan, taking medications as prescribed, and following up with medical professionals if any issues arise.   Follow Up Instructions: Clinician will send Webex link for next session. The Client was advised to call back or seek an in-person evaluation if the symptoms worsen or if the condition fails to improve as anticipated.     I provided 180 minutes of non-face-to-face time during this encounter.     Lise Auer, LCSW

## 2020-06-27 ENCOUNTER — Other Ambulatory Visit (HOSPITAL_COMMUNITY): Payer: BC Managed Care – PPO | Admitting: Psychiatry

## 2020-06-27 ENCOUNTER — Other Ambulatory Visit: Payer: Self-pay

## 2020-06-27 ENCOUNTER — Encounter (HOSPITAL_COMMUNITY): Payer: Self-pay

## 2020-06-27 DIAGNOSIS — Z79899 Other long term (current) drug therapy: Secondary | ICD-10-CM | POA: Diagnosis not present

## 2020-06-27 DIAGNOSIS — Z634 Disappearance and death of family member: Secondary | ICD-10-CM | POA: Diagnosis not present

## 2020-06-27 DIAGNOSIS — F332 Major depressive disorder, recurrent severe without psychotic features: Secondary | ICD-10-CM

## 2020-06-27 DIAGNOSIS — F329 Major depressive disorder, single episode, unspecified: Secondary | ICD-10-CM | POA: Diagnosis not present

## 2020-06-27 DIAGNOSIS — Z635 Disruption of family by separation and divorce: Secondary | ICD-10-CM | POA: Diagnosis not present

## 2020-06-27 DIAGNOSIS — F411 Generalized anxiety disorder: Secondary | ICD-10-CM | POA: Diagnosis not present

## 2020-06-27 NOTE — Progress Notes (Signed)
Virtual Visit via Video Note  I connected with Dawn Gray on 06/27/20 at  9:00 AM EST by a video enabled telemedicine application and verified that I am speaking with the correct person using two identifiers.  At orientation to the IOP program, Case Manager discussed the limitations of evaluation and management by telemedicine and the availability of in person appointments. The patient expressed understanding and agreed to proceed with virtual visits throughout the duration of the program.   Location:  Patient: Patient Home Provider: Home Office   History of Present Illness: MDD  Observations/Objective: Check In: Case Manager checked in with all participants to review discharge dates, insurance authorizations, work-related documents and needs from the treatment team. Client stated needs and engaged in discussion.     Initial Therapeutic Activity: Counselor facilitated a group processing with group members to assess mood and current functioning. Client reports that she is putting action steps to her self-love and internal work. Client communicating with new relationships more openly with appropriate boundaries. Client dicussed an optimistic vision for her future with excitement. Client presents with moderate depression and mild anxiety. Client denied any current SI/HI/psychosis.   Second Therapeutic Activity: Counselor prepared group to engage in a guided imagery script on "Finding Your Authentic Self". The guided imagery included progressive muscle relaxation, deep breathing, visualizations and positive affirmations. Group members participated by closing eyes, finding a relaxed space and following along. Client reflected on the experience by stating that it was deeply relaxing and she can see how she could use the prompts in anxiety producing situations.  Check Out: Counselor prompted group members to identify one self-care practice or productivity activity they would like to engage in over  the weekend. Client plans to spend quality time with her children and cook, which is an enjoyable activity for her. Client endorsed safety plan to be followed to prevent safety issues.  Assessment and Plan: Clinician recommends that Client remain in IOP treatment to better manage mental health symptoms, stabilization and to address treatment plan goals. Clinician recommends adherence to crisis/safety plan, taking medications as prescribed, and following up with medical professionals if any issues arise.   Follow Up Instructions: Clinician will send Webex link for next session. The Client was advised to call back or seek an in-person evaluation if the symptoms worsen or if the condition fails to improve as anticipated.     I provided 180 minutes of non-face-to-face time during this encounter.     Lise Auer, LCSW

## 2020-06-28 NOTE — Progress Notes (Signed)
Virtual Visit via Video Note  I connected with Dawn Gray on 06/17/20 at  9:00 AM EST by a video enabled telemedicine application and verified that I am speaking with the correct person using two identifiers.  Location: Patient: patient home Provider: clinical home office   I discussed the limitations of evaluation and management by telemedicine and the availability of in person appointments. The patient expressed understanding and agreed to proceed.  I discussed the assessment and treatment plan with the patient. The patient was provided an opportunity to ask questions and all were answered. The patient agreed with the plan and demonstrated an understanding of the instructions.   The patient was advised to call back or seek an in-person evaluation if the symptoms worsen or if the condition fails to improve as anticipated.  Pt was provided 180 minutes of non-face-to-face time during this encounter.   Lorin Glass, LCSW     Daily Group Progress Note  Program: IOP  Group Time: 9:00 - 11:00   Participation Level: Active  Behavioral Response: Appropriate  Type of Therapy:  Group Therapy  Summary of Progress: Clinician led check-in regarding current stressors and situation. Clinician utilized active listening and empathetic response and validated patient emotions. Clinician facilitated processing group on pertinent issues.  Patient rates hermood at a4.5on a scale of 1-10 with 10 being great. Pt states yesterday was "challenging  and she experienced a panic attack and lingering high anxiety. Pt states she attempted to do self-care to manage the anxiety with some success. Pt identifies fleeting hopelessness and managing it by thinking about her children. Pt able to process.        Group Time: 11:00 - 12:00  Participation Level:  Active  Behavioral Response: Appropriate and Sharing  Type of Therapy: Group Therapy  Summary of Progress: Cln led discussion on negative  thought processes and how they can distort the way in which we view reality. Group members shared struggles they have experienced in relationships and the negative self-talk that followed. Cln introduced CBT thought challenging principles and group worked to challenge negative thoughts shared.  Pt engaged in discussion and is able to process. Pt is supportive of other group members and active in discussion.   At checkout, pt denies SI/HI and states plans to spend time with her daughter this afternoon.    Lorin Glass, LCSW

## 2020-06-28 NOTE — Progress Notes (Signed)
Virtual Visit via Video Note  I connected with Dawn Gray on 06/16/20 at  8:00 AM EST by a video enabled telemedicine application and verified that I am speaking with the correct person using two identifiers.  Location: Patient: patient home Provider: clinical home office   I discussed the limitations of evaluation and management by telemedicine and the availability of in person appointments. The patient expressed understanding and agreed to proceed.  I discussed the assessment and treatment plan with the patient. The patient was provided an opportunity to ask questions and all were answered. The patient agreed with the plan and demonstrated an understanding of the instructions.   The patient was advised to call back or seek an in-person evaluation if the symptoms worsen or if the condition fails to improve as anticipated.  Pt was provided 180 minutes of non-face-to-face time during this encounter.   Lorin Glass, LCSW     Daily Group Progress Note  Program: IOP  Group Time: 9:00 - 11:00   Participation Level: Active  Behavioral Response: Appropriate  Type of Therapy:  Group Therapy  Summary of Progress: Clinician led check-in regarding current stressors and situation. Clinician utilized active listening and empathetic response and validated patient emotions. Clinician facilitated processing group on pertinent issues.  Patient rates hermood at [facility] a scale of 1-10 with 10 being great. Pt states she is feeling "unsettled" this morning due to it being her first day in group.        Group Time: 11:00 - 12:00  Participation Level:  Active  Behavioral Response: Appropriate and Sharing  Type of Therapy: Group Therapy  Summary of Progress: Cln C Cycenas led group on stress management. Group members worked collaboratively to create a Environmental consultant identifying physical signs, behavioral signs, emotional/psychological, and cognitive signs of stress.  Discussion then focused and encouraged group members to identify positive stress management strategies they could utilize in those moments to manage identified signs. Pt was engaged in discussion and able to note multiple positive stress management strategies to apply.  At checkout, pt denies SI/HI and states plans to rest thi afternoon.    Lorin Glass, LCSW

## 2020-06-30 ENCOUNTER — Encounter (HOSPITAL_COMMUNITY): Payer: Self-pay | Admitting: Psychiatry

## 2020-06-30 ENCOUNTER — Other Ambulatory Visit (HOSPITAL_COMMUNITY): Payer: BC Managed Care – PPO | Admitting: Psychiatry

## 2020-06-30 ENCOUNTER — Other Ambulatory Visit: Payer: Self-pay

## 2020-06-30 DIAGNOSIS — F332 Major depressive disorder, recurrent severe without psychotic features: Secondary | ICD-10-CM

## 2020-06-30 DIAGNOSIS — F329 Major depressive disorder, single episode, unspecified: Secondary | ICD-10-CM | POA: Diagnosis not present

## 2020-06-30 DIAGNOSIS — Z79899 Other long term (current) drug therapy: Secondary | ICD-10-CM | POA: Diagnosis not present

## 2020-06-30 DIAGNOSIS — F411 Generalized anxiety disorder: Secondary | ICD-10-CM | POA: Diagnosis not present

## 2020-06-30 DIAGNOSIS — Z635 Disruption of family by separation and divorce: Secondary | ICD-10-CM | POA: Diagnosis not present

## 2020-06-30 DIAGNOSIS — Z634 Disappearance and death of family member: Secondary | ICD-10-CM | POA: Diagnosis not present

## 2020-06-30 NOTE — Progress Notes (Signed)
Virtual Visit via Video Note  I connected with Dawn Gray on 06/30/20 at  9:00 AM EST by a video enabled telemedicine application and verified that I am speaking with the correct person using two identifiers.  At orientation to the IOP program, Case Manager discussed the limitations of evaluation and management by telemedicine and the availability of in person appointments. The patient expressed understanding and agreed to proceed with virtual visits throughout the duration of the program.   Location:  Patient: Patient Home Provider: Home Office   History of Present Illness: MDD  Observations/Objective: Check In: Case Manager checked in with all participants to review discharge dates, insurance authorizations, work-related documents and needs from the treatment team. Client stated needs and engaged in discussion.     Initial Therapeutic Activity: Counselor facilitated a group processing with group members to assess mood and current functioning. Client reports that she is currently upset and frustrated due to being triggered by treatment of car service men. Client discussed how she plans to handle the situation directly and respectfully to address the problem. Client discussed time shared with family over the weekend, that she is better able to be present and in the moment, compared to high anxiety a few weeks ago. Client presents with mild depression and mild anxiety. Client denied any current SI/HI/psychosis.   Second Therapeutic Activity: Counselor facilitated discussion around utilization and purging of support systems. Counselor allowed group members to share about who they allow in the support system and recovery plan. Group members discussed qualities of good friends versus toxic friends. Group additional discussed substance abuse issues within friend groups and how they impacts mental health recovery. Client noted that self-advocacy is important in setting boundaries. Client discussed her  support system strengths and how finding friends locally has been a challenge. Client looking to make new connections.   Check Out: Counselor prompted group members to identify one self-care practice or productivity activity they would like to engage in today. Client plans to address car issue and spend time with family. Client endorsed safety plan to be followed to prevent safety issues.  Assessment and Plan: Clinician recommends that Client remain in IOP treatment to better manage mental health symptoms, stabilization and to address treatment plan goals. Clinician recommends adherence to crisis/safety plan, taking medications as prescribed, and following up with medical professionals if any issues arise.   Follow Up Instructions: Clinician will send Webex link for next session. The Client was advised to call back or seek an in-person evaluation if the symptoms worsen or if the condition fails to improve as anticipated.     I provided 180 minutes of non-face-to-face time during this encounter.     Lise Auer, LCSW

## 2020-07-01 ENCOUNTER — Other Ambulatory Visit (HOSPITAL_COMMUNITY): Payer: BC Managed Care – PPO | Admitting: Psychiatry

## 2020-07-01 ENCOUNTER — Encounter (HOSPITAL_COMMUNITY): Payer: Self-pay

## 2020-07-01 ENCOUNTER — Other Ambulatory Visit: Payer: Self-pay

## 2020-07-01 DIAGNOSIS — Z635 Disruption of family by separation and divorce: Secondary | ICD-10-CM | POA: Diagnosis not present

## 2020-07-01 DIAGNOSIS — Z79899 Other long term (current) drug therapy: Secondary | ICD-10-CM | POA: Diagnosis not present

## 2020-07-01 DIAGNOSIS — F332 Major depressive disorder, recurrent severe without psychotic features: Secondary | ICD-10-CM

## 2020-07-01 DIAGNOSIS — F411 Generalized anxiety disorder: Secondary | ICD-10-CM | POA: Diagnosis not present

## 2020-07-01 DIAGNOSIS — Z634 Disappearance and death of family member: Secondary | ICD-10-CM | POA: Diagnosis not present

## 2020-07-01 DIAGNOSIS — F329 Major depressive disorder, single episode, unspecified: Secondary | ICD-10-CM | POA: Diagnosis not present

## 2020-07-01 NOTE — Progress Notes (Signed)
Virtual Visit via Video Note  I connected with Dawn Gray on 07/01/20 at  9:00 AM EST by a video enabled telemedicine application and verified that I am speaking with the correct person using two identifiers.  At orientation to the IOP program, Case Manager discussed the limitations of evaluation and management by telemedicine and the availability of in person appointments. The patient expressed understanding and agreed to proceed with virtual visits throughout the duration of the program.   Location:  Patient: Patient Home Provider: Home Office   History of Present Illness: MDD  Observations/Objective: Check In: Case Manager checked in with all participants to review discharge dates, insurance authorizations, work-related documents and needs from the treatment team. Client stated needs and engaged in discussion.     Initial Therapeutic Activity: Counselor facilitated a group processing with group members to assess mood and current functioning. Client reports that she wasn't feeling well today, experiencing a headache and underlying frustrations about a stressor in her life. Client discussed a plan to address the stressor to break her anxiety cycle. Client noted that she enjoyed spending time with family yesterday. Client shared that she has began taking an herbology course online and is enjoying it so far.  Client presents with moderate depression and moderate anxiety. Client denied any current SI/HI/psychosis.   Second Therapeutic Activity: Counselor shared psychoeducation and skill building around coping with Social Anxiety and other anxieties. Counselor shared a therapeutic video from [employer] on Rewiring the Anxious Brain, sharing 10 skills in better managing anxiety levels. Counselor paused to allow group time to write down their fears and their motivators to overcome anxiety. Client noted that her fears are associated with a trauma that happened with her exhusbands family.  Client discussed ways to face fears and set appropriate boundaries. Counselor discussed skill application for each scenario and shared additional worksheets for Clients to complete for homework.  Check Out: Counselor prompted group members to identify one self-care practice or productivity activity they would like to engage in today. Client plans to care for her plants and do more worksheets from therapy. Client endorsed safety plan to be followed to prevent safety issues.  Assessment and Plan: Clinician recommends that Client remain in IOP treatment to better manage mental health symptoms, stabilization and to address treatment plan goals. Clinician recommends adherence to crisis/safety plan, taking medications as prescribed, and following up with medical professionals if any issues arise.   Follow Up Instructions: Clinician will send Webex link for next session. The Client was advised to call back or seek an in-person evaluation if the symptoms worsen or if the condition fails to improve as anticipated.     I provided 180 minutes of non-face-to-face time during this encounter.     Dawn Auer, LCSW

## 2020-07-02 ENCOUNTER — Other Ambulatory Visit: Payer: Self-pay

## 2020-07-02 ENCOUNTER — Other Ambulatory Visit (HOSPITAL_COMMUNITY): Payer: BC Managed Care – PPO | Admitting: Psychiatry

## 2020-07-02 ENCOUNTER — Encounter (HOSPITAL_COMMUNITY): Payer: Self-pay | Admitting: Psychiatry

## 2020-07-02 DIAGNOSIS — Z635 Disruption of family by separation and divorce: Secondary | ICD-10-CM | POA: Diagnosis not present

## 2020-07-02 DIAGNOSIS — F332 Major depressive disorder, recurrent severe without psychotic features: Secondary | ICD-10-CM

## 2020-07-02 DIAGNOSIS — F411 Generalized anxiety disorder: Secondary | ICD-10-CM | POA: Diagnosis not present

## 2020-07-02 DIAGNOSIS — Z634 Disappearance and death of family member: Secondary | ICD-10-CM | POA: Diagnosis not present

## 2020-07-02 DIAGNOSIS — F329 Major depressive disorder, single episode, unspecified: Secondary | ICD-10-CM | POA: Diagnosis not present

## 2020-07-02 DIAGNOSIS — Z79899 Other long term (current) drug therapy: Secondary | ICD-10-CM | POA: Diagnosis not present

## 2020-07-02 NOTE — Progress Notes (Signed)
Virtual Visit via Video Note  I connected with [name] on 07/02/20 at  9:00 AM EST by a video enabled telemedicine application and verified that I am speaking with the correct person using two identifiers.  At orientation to the IOP program, Case Manager discussed the limitations of evaluation and management by telemedicine and the availability of in person appointments. The patient expressed understanding and agreed to proceed with virtual visits throughout the duration of the program.   Location:  Patient: Patient Home Provider: Home Office   History of Present Illness: MDD  Observations/Objective: Check In: Case Manager checked in with all participants to review discharge dates, insurance authorizations, work-related documents and needs from the treatment team. Client stated needs and engaged in discussion.     Initial Therapeutic Activity: Counselor facilitated a group processing with group members to assess mood and current functioning. Client reports that she is feeling better than yesterday. Client notes improved energy after doing self-care and taking a nap yesterday. Client presents with moderate depression and moderate anxiety. Client denied any current SI/HI/psychosis. Group engaged in discussion on future plans and careers that promote work-life balance, grief and loss issues, guilt, and family dynamics.  Second Therapeutic Activity: Counselor introduced Agricultural consultant, Demetra Shiner, Director of Wellness to present information on [employer] and Benefits. Clients engaged in activities and discussion, personalizing the information to their individual circumstances. Client motivated to make small changes in wellness practices to improve overall mental health.  Check Out: Counselor prompted group members to identify one self-care practice or productivity activity they would like to engage in today. Client plans to communicate boundaries with extended in-laws and look into a  growth opportunity. Client endorsed safety plan to be followed to prevent safety issues.  Assessment and Plan: Clinician recommends that Client remain in IOP treatment to better manage mental health symptoms, stabilization and to address treatment plan goals. Clinician recommends adherence to crisis/safety plan, taking medications as prescribed, and following up with medical professionals if any issues arise.   Follow Up Instructions: Clinician will send Webex link for next session. The Client was advised to call back or seek an in-person evaluation if the symptoms worsen or if the condition fails to improve as anticipated.     I provided 180 minutes of non-face-to-face time during this encounter.     Lise Auer, LCSW

## 2020-07-03 ENCOUNTER — Other Ambulatory Visit: Payer: Self-pay

## 2020-07-03 ENCOUNTER — Telehealth: Payer: Self-pay | Admitting: Family Medicine

## 2020-07-03 ENCOUNTER — Encounter (HOSPITAL_COMMUNITY): Payer: Self-pay

## 2020-07-03 ENCOUNTER — Other Ambulatory Visit (HOSPITAL_COMMUNITY): Payer: BC Managed Care – PPO | Admitting: Psychiatry

## 2020-07-03 DIAGNOSIS — Z634 Disappearance and death of family member: Secondary | ICD-10-CM | POA: Diagnosis not present

## 2020-07-03 DIAGNOSIS — Z79899 Other long term (current) drug therapy: Secondary | ICD-10-CM | POA: Diagnosis not present

## 2020-07-03 DIAGNOSIS — F329 Major depressive disorder, single episode, unspecified: Secondary | ICD-10-CM | POA: Diagnosis not present

## 2020-07-03 DIAGNOSIS — Z635 Disruption of family by separation and divorce: Secondary | ICD-10-CM | POA: Diagnosis not present

## 2020-07-03 DIAGNOSIS — F411 Generalized anxiety disorder: Secondary | ICD-10-CM | POA: Diagnosis not present

## 2020-07-03 DIAGNOSIS — F332 Major depressive disorder, recurrent severe without psychotic features: Secondary | ICD-10-CM

## 2020-07-03 NOTE — Progress Notes (Signed)
Virtual Visit via Video Note  I connected with Dawn Gray on 07/03/20 at  9:00 AM EST by a video enabled telemedicine application and verified that I am speaking with the correct person using two identifiers.  At orientation to the IOP program, Case Manager discussed the limitations of evaluation and management by telemedicine and the availability of in person appointments. The patient expressed understanding and agreed to proceed with virtual visits throughout the duration of the program.   Location:  Patient: Patient Home Provider: Home Office   History of Present Illness: MDD  Observations/Objective: Check In: Case Manager checked in with all participants to review discharge dates, insurance authorizations, work-related documents and needs from the treatment team. Client stated needs and engaged in discussion. Counselor facilitated a group processing with group members to assess mood and current functioning. Client reports that felt good enough emotionally and mentally to get out of her house to grocery shop and visit a friend. Client reports being motivated to tackle more items on her to do list. Client presents with moderate depression and mild anxiety. Client denied any current SI/HI/psychosis. Group engaged in discussion on future plans and careers that promote work-life balance, grief and loss issues, guilt, and family dynamics.    Initial Therapeutic Activity: Counselor introduced Rolin Barry, [place] Chaplain to present information and discussion on Grief and Loss. Group members engaged in discussion, sharing how grief impacts them, what comforts them, what emotions are felt, labeling losses, etc. After guest speaker logged off, Counselor prompted group to spend 10-15 minutes journaling to process personal grief and loss situations. Counselor processed entries with group and client's identified areas for additional processing in individual therapy.   Second Therapeutic Activity:  Counselor continued topic of Social Anxiety from earlier in the week. Counselor introduced a scientific, research-based video explaining root causes of social anxiety. Counselor prompted Group Members to share their takeaways from the video. Client noted that she has become less social and outgoing as she has aged and changed roles in life. Client is aware of her behaviors and actions when in public settings and reflected back on childhood/parental influences. Counselor challenged group members to find ways to approach their social anxieties differently and to engage in conversations with primary caregivers on childhood influences.   Check Out: Counselor prompted group members to identify one self-care practice or productivity activity they would like to engage in today. Client plans to rewatch the video from group and work on getting car repaired. Client endorsed safety plan to be followed to prevent safety issues.  Assessment and Plan: Clinician recommends that Client remain in IOP treatment to better manage mental health symptoms, stabilization and to address treatment plan goals. Clinician recommends adherence to crisis/safety plan, taking medications as prescribed, and following up with medical professionals if any issues arise.   Follow Up Instructions: Clinician will send Webex link for next session. The Client was advised to call back or seek an in-person evaluation if the symptoms worsen or if the condition fails to improve as anticipated.     I provided 180 minutes of non-face-to-face time during this encounter.     Lise Auer, LCSW

## 2020-07-03 NOTE — Telephone Encounter (Signed)
Document faxed to our office to be filled out ([place] 10 pages) Document put at front office tray under providers name.

## 2020-07-03 NOTE — Telephone Encounter (Signed)
Received

## 2020-07-04 ENCOUNTER — Other Ambulatory Visit (HOSPITAL_COMMUNITY): Payer: BC Managed Care – PPO | Admitting: Psychiatry

## 2020-07-04 ENCOUNTER — Other Ambulatory Visit: Payer: Self-pay

## 2020-07-04 ENCOUNTER — Encounter (HOSPITAL_COMMUNITY): Payer: Self-pay

## 2020-07-04 DIAGNOSIS — Z635 Disruption of family by separation and divorce: Secondary | ICD-10-CM | POA: Diagnosis not present

## 2020-07-04 DIAGNOSIS — Z79899 Other long term (current) drug therapy: Secondary | ICD-10-CM | POA: Diagnosis not present

## 2020-07-04 DIAGNOSIS — F411 Generalized anxiety disorder: Secondary | ICD-10-CM | POA: Diagnosis not present

## 2020-07-04 DIAGNOSIS — Z634 Disappearance and death of family member: Secondary | ICD-10-CM | POA: Diagnosis not present

## 2020-07-04 DIAGNOSIS — F329 Major depressive disorder, single episode, unspecified: Secondary | ICD-10-CM | POA: Diagnosis not present

## 2020-07-04 DIAGNOSIS — F332 Major depressive disorder, recurrent severe without psychotic features: Secondary | ICD-10-CM

## 2020-07-04 NOTE — Telephone Encounter (Signed)
Placed in folder for [employer]

## 2020-07-04 NOTE — Progress Notes (Signed)
Virtual Visit via Video Note  I connected with Dawn Gray on 07/04/20 at  9:00 AM EST by a video enabled telemedicine application and verified that I am speaking with the correct person using two identifiers.  At orientation to the IOP program, Case Manager discussed the limitations of evaluation and management by telemedicine and the availability of in person appointments. The patient expressed understanding and agreed to proceed with virtual visits throughout the duration of the program.   Location:  Patient: Patient Home Provider: Home Office   History of Present Illness: MDD  Observations/Objective: Check In: Case Manager checked in with all participants to review discharge dates, insurance authorizations, work-related documents and needs from the treatment team. Client stated needs and engaged in discussion. Counselor facilitated a group processing with group members to assess mood and current functioning. Client reports that she had a challenging night and morning causing her to be in a "bad mood". Client shared about her car troubles and conflict with her oldest child. Client advocated for self, set boundaries/expectations, communicated needs clearly and prevented self having a panic attack by self-soothing and talking with a friend. Client identified that she needs more support and acknowledgment of her feelings and experience from her children. Client presents with moderate depression and moderate anxiety. Client denied any current SI/HI/psychosis.      Initial Therapeutic Activity: Counselor prompted group members to share coping skills and resources they personally use to manage/stabilize/improve their mental health. Group members shared useful apps, websites, sensory items, books and ideas with group. Client participated in sharing and discussion.   Second Therapeutic Activity: Counselor provided group with an exhaustive list of community, state and national mental health, substance  abuse and basic need resources. Counselor prompted group members to save numbers in their phones and to bookmark links for future use.   Check Out: Counselor prompted group members to identify one self-care practice or productivity activity they would like to engage in today. Client plans to take a hot bath, spend time in the sauna, journal and rest. Client endorsed safety plan to be followed to prevent safety issues.  Assessment and Plan: Clinician recommends that Client remain in IOP treatment to better manage mental health symptoms, stabilization and to address treatment plan goals. Clinician recommends adherence to crisis/safety plan, taking medications as prescribed, and following up with medical professionals if any issues arise.   Follow Up Instructions: Clinician will send Webex link for next session. The Client was advised to call back or seek an in-person evaluation if the symptoms worsen or if the condition fails to improve as anticipated.     I provided 180 minutes of non-face-to-face time during this encounter.     Lise Auer, LCSW

## 2020-07-07 ENCOUNTER — Encounter (HOSPITAL_COMMUNITY): Payer: Self-pay

## 2020-07-07 ENCOUNTER — Other Ambulatory Visit (HOSPITAL_COMMUNITY): Payer: BC Managed Care – PPO | Admitting: Psychiatry

## 2020-07-07 ENCOUNTER — Other Ambulatory Visit: Payer: Self-pay

## 2020-07-07 ENCOUNTER — Other Ambulatory Visit (HOSPITAL_COMMUNITY): Payer: BC Managed Care – PPO

## 2020-07-07 DIAGNOSIS — F332 Major depressive disorder, recurrent severe without psychotic features: Secondary | ICD-10-CM

## 2020-07-07 DIAGNOSIS — Z635 Disruption of family by separation and divorce: Secondary | ICD-10-CM | POA: Diagnosis not present

## 2020-07-07 DIAGNOSIS — Z79899 Other long term (current) drug therapy: Secondary | ICD-10-CM | POA: Diagnosis not present

## 2020-07-07 DIAGNOSIS — F329 Major depressive disorder, single episode, unspecified: Secondary | ICD-10-CM | POA: Diagnosis not present

## 2020-07-07 DIAGNOSIS — F411 Generalized anxiety disorder: Secondary | ICD-10-CM | POA: Diagnosis not present

## 2020-07-07 DIAGNOSIS — Z634 Disappearance and death of family member: Secondary | ICD-10-CM | POA: Diagnosis not present

## 2020-07-07 NOTE — Progress Notes (Signed)
Virtual Visit via Video Note  I connected with Dawn Gray on 07/07/20 at  9:00 AM EST by a video enabled telemedicine application and verified that I am speaking with the correct person using two identifiers.  At orientation to the IOP program, Case Manager discussed the limitations of evaluation and management by telemedicine and the availability of in person appointments. The patient expressed understanding and agreed to proceed with virtual visits throughout the duration of the program.   Location:  Patient: Patient Home Provider: Home Office   History of Present Illness: MDD  Observations/Objective: Check In: Case Manager checked in with all participants to review discharge dates, insurance authorizations, work-related documents and needs from the treatment team. Client stated needs and engaged in discussion. Case Manager introduced 2 new group members and prompted group to engage in the joining process.     Initial Therapeutic Activity: Counselor facilitated a group processing with group members to assess mood and current functioning. Counselor prompted group members to share about new life stressors, utilization of coping skills, and progress in treatment. Client reports that she applied skill of resting and relaxing over the weekend. Client shared about 2 resources she found that were helpful in how she views her depression and anxiety. Client reports that she kept boundaries set with older children, reestablishing how she will be treated and her needs. Client presents with moderate depression and moderate anxiety. Client denied any current SI/HI/psychosis.   Second Therapeutic Activity: Counselor prompted group members to share about what coping skills have been helpful over their course of treatment, benefits of group treatment vs individual therapy, and topics they are interested in learning more about in the coming days. Group members engaged in discussion, sharing thoughts,  experiences and ideas. Client noted that she has appreciated boundary setting, grief work, and anxiety reducing coping skills. Client appreciates the informal supports that have developed through group processing.  Check Out: Counselor prompted group members to identify one self-care practice or productivity activity they would like to engage in today. Client plans to cook, spend time with daughter and exercise this afternoon. Client endorsed safety plan to be followed to prevent safety issues.  Assessment and Plan: Clinician recommends that Client remain in IOP treatment to better manage mental health symptoms, stabilization and to address treatment plan goals. Clinician recommends adherence to crisis/safety plan, taking medications as prescribed, and following up with medical professionals if any issues arise.   Follow Up Instructions: Clinician will send Webex link for next session. The Client was advised to call back or seek an in-person evaluation if the symptoms worsen or if the condition fails to improve as anticipated.     I provided 180 minutes of non-face-to-face time during this encounter.     Lise Auer, LCSW

## 2020-07-08 ENCOUNTER — Ambulatory Visit (HOSPITAL_COMMUNITY): Payer: BC Managed Care – PPO

## 2020-07-08 ENCOUNTER — Encounter (HOSPITAL_COMMUNITY): Payer: Self-pay | Admitting: Psychiatry

## 2020-07-08 ENCOUNTER — Other Ambulatory Visit (HOSPITAL_COMMUNITY): Payer: BC Managed Care – PPO | Admitting: Psychiatry

## 2020-07-08 ENCOUNTER — Other Ambulatory Visit: Payer: Self-pay

## 2020-07-08 DIAGNOSIS — Z634 Disappearance and death of family member: Secondary | ICD-10-CM | POA: Diagnosis not present

## 2020-07-08 DIAGNOSIS — Z635 Disruption of family by separation and divorce: Secondary | ICD-10-CM | POA: Diagnosis not present

## 2020-07-08 DIAGNOSIS — F332 Major depressive disorder, recurrent severe without psychotic features: Secondary | ICD-10-CM

## 2020-07-08 DIAGNOSIS — F411 Generalized anxiety disorder: Secondary | ICD-10-CM | POA: Diagnosis not present

## 2020-07-08 DIAGNOSIS — Z79899 Other long term (current) drug therapy: Secondary | ICD-10-CM | POA: Diagnosis not present

## 2020-07-08 DIAGNOSIS — F329 Major depressive disorder, single episode, unspecified: Secondary | ICD-10-CM | POA: Diagnosis not present

## 2020-07-08 NOTE — Progress Notes (Incomplete)
Virtual Visit via Video Note  I connected with [name] on 07/08/20 at  9:00 AM EST by a video enabled telemedicine application and verified that I am speaking with the correct person using two identifiers.  At orientation to the IOP program, Case Manager discussed the limitations of evaluation and management by telemedicine and the availability of in person appointments. The patient expressed understanding and agreed to proceed with virtual visits throughout the duration of the program.   Location:  Patient: Patient Home Provider: Home Office   History of Present Illness: MDD  Observations/Objective: Check In: Case Manager checked in with all participants to review discharge dates, insurance authorizations, work-related documents and needs from the treatment team. Client stated needs and engaged in discussion.      Initial Therapeutic Activity: Counselor facilitated a group processing with group members to assess mood and current functioning. Counselor prompted group members to share about new life stressors, utilization of coping skills, and progress in treatment. Client reports _. Client presents with _ depression and _ anxiety. Client denied any current SI/HI/psychosis.   Second Therapeutic Activity: Counselor prompted group members to reflect on recurrent negative or unhealthy thoughts they've noticed by making a list for 3-5 minutes. Group members shared their reflections aloud, with Client stating _. Counselor provided Group with a video highlighting 15 Cognitive Distortions. Counselor prompted group members to note which styles of distorted thinking they notice most internally. Client identified that the styles she struggles with are _. Counselor provided group with 5 cognitive restructuring worksheets to complete for homework to rework negative/unhealthy thoughts to more positive and rational.  Check Out: Counselor closed program by allowing time to celebrate a graduating group  member. Counselor shared reflections on progress and allow space for group members to share well wishes and appreciates to the graduating client. Counselor prompted graduating client to share takeaways, reflect on progress and final thoughts for the group. Client endorsed safety plan to be followed to prevent safety issues.  Assessment and Plan: Clinician recommends that Client remain in IOP treatment to better manage mental health symptoms, stabilization and to address treatment plan goals. Clinician recommends adherence to crisis/safety plan, taking medications as prescribed, and following up with medical professionals if any issues arise.   Follow Up Instructions: Clinician will send Webex link for next session. The Client was advised to call back or seek an in-person evaluation if the symptoms worsen or if the condition fails to improve as anticipated.     I provided 180 minutes of non-face-to-face time during this encounter.     Lise Auer, LCSW

## 2020-07-08 NOTE — Progress Notes (Signed)
Virtual Visit via Video Note  I connected with [name] on 07/08/20 at  9:00 AM EST by a video enabled telemedicine application and verified that I am speaking with the correct person using two identifiers.  At orientation to the IOP program, Case Manager discussed the limitations of evaluation and management by telemedicine and the availability of in person appointments. The patient expressed understanding and agreed to proceed with virtual visits throughout the duration of the program.   Location:  Patient: Patient Home Provider: Home Office   History of Present Illness: MDD  Observations/Objective: Check In: Case Manager checked in with all participants to review discharge dates, insurance authorizations, work-related documents and needs from the treatment team. Client stated needs and engaged in discussion.      Initial Therapeutic Activity: Counselor facilitated a group processing with group members to assess mood and current functioning. Counselor prompted group members to share about new life stressors, utilization of coping skills, and progress in treatment. Client reports that she spent time with her daughter and close friends doing activities and watching movies, which distracted her from implementing her intended self-care of exercising and meal preping. Client was accepting of how day when with out anxiety increasing, as she utilized mindfulness practices to be in the moment. Client presents with moderate depression and mild anxiety. Client denied any current SI/HI/psychosis.   Second Therapeutic Activity: Counselor prompted group members to reflect on recurrent negative or unhealthy thoughts theyve noticed by making a list for 3-5 minutes. Group members shared their reflections aloud, with Client stating that she has always been an optimistic thinker, however she has had more anxious thoughts and thoughts of being unloved or not enough within her family and friends. Counselor  provided Group with a video highlighting 15 Cognitive Distortions. Counselor prompted group members to note which styles of distorted thinking they notice most internally. Client identified that the styles she struggles with are emotional reasoning and personalization. Counselor provided group with 5 cognitive restructuring worksheets to complete for homework to rework negative/unhealthy thoughts to more positive and rational.  Check Out: Counselor closed program by allowing time to celebrate a graduating group member. Counselor shared reflections on progress and allow space for group members to share well wishes and appreciates to the graduating client. Counselor prompted graduating client to share takeaways, reflect on progress and final thoughts for the group. Client endorsed safety plan to be followed to prevent safety issues.  Assessment and Plan: Clinician recommends that Client remain in IOP treatment to better manage mental health symptoms, stabilization and to address treatment plan goals. Clinician recommends adherence to crisis/safety plan, taking medications as prescribed, and following up with medical professionals if any issues arise.   Follow Up Instructions: Clinician will send Webex link for next session. The Client was advised to call back or seek an in-person evaluation if the symptoms worsen or if the condition fails to improve as anticipated.     I provided 180 minutes of non-face-to-face time during this encounter.     Lise Auer, LCSW

## 2020-07-09 ENCOUNTER — Ambulatory Visit (HOSPITAL_COMMUNITY): Payer: BC Managed Care – PPO

## 2020-07-09 ENCOUNTER — Other Ambulatory Visit (HOSPITAL_COMMUNITY): Payer: BC Managed Care – PPO | Admitting: Psychiatry

## 2020-07-09 ENCOUNTER — Encounter (HOSPITAL_COMMUNITY): Payer: Self-pay

## 2020-07-09 ENCOUNTER — Other Ambulatory Visit: Payer: Self-pay

## 2020-07-09 DIAGNOSIS — Z79899 Other long term (current) drug therapy: Secondary | ICD-10-CM | POA: Diagnosis not present

## 2020-07-09 DIAGNOSIS — Z634 Disappearance and death of family member: Secondary | ICD-10-CM | POA: Diagnosis not present

## 2020-07-09 DIAGNOSIS — Z635 Disruption of family by separation and divorce: Secondary | ICD-10-CM | POA: Diagnosis not present

## 2020-07-09 DIAGNOSIS — F411 Generalized anxiety disorder: Secondary | ICD-10-CM | POA: Diagnosis not present

## 2020-07-09 DIAGNOSIS — F332 Major depressive disorder, recurrent severe without psychotic features: Secondary | ICD-10-CM

## 2020-07-09 DIAGNOSIS — F329 Major depressive disorder, single episode, unspecified: Secondary | ICD-10-CM | POA: Diagnosis not present

## 2020-07-09 NOTE — Progress Notes (Signed)
Virtual Visit via Video Note  I connected with Dawn Gray on 07/09/20 at  9:00 AM EST by a video enabled telemedicine application and verified that I am speaking with the correct person using two identifiers.  At orientation to the IOP program, Case Manager discussed the limitations of evaluation and management by telemedicine and the availability of in person appointments. The patient expressed understanding and agreed to proceed with virtual visits throughout the duration of the program.   Location:  Patient: Patient Home Provider: Home Office   History of Present Illness: MDD  Observations/Objective: Check In: Case Manager checked in with all participants to review discharge dates, insurance authorizations, work-related documents and needs from the treatment team. Client stated needs and engaged in discussion.      Initial Therapeutic Activity: Counselor facilitated a group processing with group members to assess mood and current functioning. Counselor prompted group members to share about new life stressors, utilization of coping skills, and progress in treatment. Client reports that she had a challenging day yesterday, as she had to confront adult son about disrespect and set boundaries which included him moving out of her home. Client was proud of how she managed her anxiety and anger. Client reports feeling empowered to stand up for herself and how she will allow others to treat her. Client presents with moderate depression and moderate anxiety. Client denied any current SI/HI/psychosis.   Second Therapeutic Activity: Counselor introduced our guest speaker, Einar Grad, [employer] Pharmacist, who shared about psychiatric medications, side effects, treatment considerations and how to communicate with medical professionals. Group Members asked questions and shared medication concerns. Counselor prompted group members to reference a worksheet called, "Body Scan" to jot down questions and concerns  about their physical health in preparation for their upcoming appointments with medical professionals. Client discussed need for a physical, eye appointment, dental appointment and follow up on migraine care. Counselor encouraged routine medical check-ups, preparing for appointments, following up with recommendations and seeking specialist if needed.  Check Out: Counselor closed program prompting group members to share one self-care practice or productivity activity they plan to apply over the coming days. Client chose to contact providers to set up appointments. Client endorsed safety plan to be followed to prevent safety issues.  Assessment and Plan: Clinician recommends that Client remain in IOP treatment to better manage mental health symptoms, stabilization and to address treatment plan goals. Clinician recommends adherence to crisis/safety plan, taking medications as prescribed, and following up with medical professionals if any issues arise.   Follow Up Instructions: Clinician will send Webex link for next session. The Client was advised to call back or seek an in-person evaluation if the symptoms worsen or if the condition fails to improve as anticipated.     I provided 180 minutes of non-face-to-face time during this encounter.     Lise Auer, LCSW

## 2020-07-10 ENCOUNTER — Encounter (HOSPITAL_COMMUNITY): Payer: Self-pay

## 2020-07-10 ENCOUNTER — Ambulatory Visit (HOSPITAL_COMMUNITY): Payer: BC Managed Care – PPO

## 2020-07-10 ENCOUNTER — Other Ambulatory Visit (HOSPITAL_COMMUNITY): Payer: BC Managed Care – PPO | Admitting: Psychiatry

## 2020-07-10 ENCOUNTER — Other Ambulatory Visit: Payer: Self-pay

## 2020-07-10 DIAGNOSIS — Z635 Disruption of family by separation and divorce: Secondary | ICD-10-CM | POA: Diagnosis not present

## 2020-07-10 DIAGNOSIS — Z79899 Other long term (current) drug therapy: Secondary | ICD-10-CM | POA: Diagnosis not present

## 2020-07-10 DIAGNOSIS — Z634 Disappearance and death of family member: Secondary | ICD-10-CM | POA: Diagnosis not present

## 2020-07-10 DIAGNOSIS — F332 Major depressive disorder, recurrent severe without psychotic features: Secondary | ICD-10-CM

## 2020-07-10 DIAGNOSIS — F411 Generalized anxiety disorder: Secondary | ICD-10-CM | POA: Diagnosis not present

## 2020-07-10 DIAGNOSIS — F329 Major depressive disorder, single episode, unspecified: Secondary | ICD-10-CM | POA: Diagnosis not present
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
## 2020-07-10 NOTE — Patient Instructions (Signed)
D:  Pt will complete MH-IOP 07-11-20.  A:  Follow up with PCP and Francie Massing, [facility].  Encouraged support groups.  Recommended to return to work on 07-14-20 (or as [place] states).  R:  Patient receptive.

## 2020-07-10 NOTE — Progress Notes (Addendum)
Virtual Visit via Video Note  I connected with Dawn Gray on 07/10/20 at  9:00 AM EST by a video enabled telemedicine application and verified that I am speaking with the correct person using two identifiers.  Location: Patient: home Provider: home   I discussed the limitations of evaluation and management by telemedicine and the availability of in person appointments. The patient expressed understanding and agreed to proceed.    I discussed the assessment and treatment plan with the patient. The patient was provided an opportunity to ask questions and all were answered. The patient agreed with the plan and demonstrated an understanding of the instructions.   The patient was advised to call back or seek an in-person evaluation if the symptoms worsen or if the condition fails to improve as anticipated.  I provided 15 minutes of non-face-to-face time during this encounter.   Lise Auer, LCSW

## 2020-07-10 NOTE — Progress Notes (Addendum)
Virtual Visit via Video Note  I connected with Dawn Gray on 07/10/20 at  9:00 AM EST by a video enabled telemedicine application and verified that I am speaking with the correct person using two identifiers.  At orientation to the IOP program, Case Manager discussed the limitations of evaluation and management by telemedicine and the availability of in person appointments. The patient expressed understanding and agreed to proceed with virtual visits throughout the duration of the program.   Location:  Patient: Patient Home Provider: Home Office   History of Present Illness: MDD  Observations/Objective: Check In: Case Manager checked in with all participants to review discharge dates, insurance authorizations, work-related documents and needs from the treatment team. Client stated needs and engaged in discussion.      Initial Therapeutic Activity: Counselor introduced Rolin Barry, [place] Chaplain to present information and discussion on Grief and Loss. Group members engaged in discussion, sharing how grief impacts them, what comforts them, what emotions are felt, labeling losses, etc. After guest speaker logged off, Counselor prompted group to spend 10-15 minutes journaling to process personal grief and loss situations. Counselor processed entries with group and client's identified areas for additional processing in individual therapy. Client noted that they need concrete ways to grieve in doses, as they have lost over 20 people in the past few years and grief is overwhelming. Counselor shared links on creative grief work ideas. Client noted interest in a few that made sense for how she can process externally.   Second Therapeutic Activity: Counselor facilitated a group processing with group members to assess mood and current functioning. Counselor prompted group members to share about new life stressors, utilization of coping skills, and progress in treatment. Client reports that she is  continuing to make firm boundaries with son, which is giving her peace. Client spent time helping a close friend and took time to get her hair done for self care. Client presents with moderate depression and moderate anxiety. Client denied any current SI/HI/psychosis.  Check Out: Counselor closed program prompting group members to share one self-care practice or productivity activity they plan to apply today. Client plans to engage in plant therapy, tending to her house plants. Client endorsed safety plan to be followed to prevent safety issues.  Assessment and Plan: Clinician recommends that Client remain in IOP treatment to better manage mental health symptoms, stabilization and to address treatment plan goals. Clinician recommends adherence to crisis/safety plan, taking medications as prescribed, and following up with medical professionals if any issues arise.   Follow Up Instructions: Clinician will send Webex link for next session. The Client was advised to call back or seek an in-person evaluation if the symptoms worsen or if the condition fails to improve as anticipated.     I provided 180 minutes of non-face-to-face time during this encounter.     Lise Auer, LCSW

## 2020-07-11 ENCOUNTER — Other Ambulatory Visit (HOSPITAL_COMMUNITY): Payer: BC Managed Care – PPO | Admitting: Psychiatry

## 2020-07-11 ENCOUNTER — Ambulatory Visit (HOSPITAL_COMMUNITY): Payer: BC Managed Care – PPO

## 2020-07-11 ENCOUNTER — Other Ambulatory Visit: Payer: Self-pay

## 2020-07-11 ENCOUNTER — Encounter (HOSPITAL_COMMUNITY): Payer: Self-pay | Admitting: Psychiatry

## 2020-07-11 DIAGNOSIS — F332 Major depressive disorder, recurrent severe without psychotic features: Secondary | ICD-10-CM

## 2020-07-11 DIAGNOSIS — F411 Generalized anxiety disorder: Secondary | ICD-10-CM | POA: Diagnosis not present

## 2020-07-11 DIAGNOSIS — F329 Major depressive disorder, single episode, unspecified: Secondary | ICD-10-CM | POA: Diagnosis not present

## 2020-07-11 DIAGNOSIS — Z635 Disruption of family by separation and divorce: Secondary | ICD-10-CM | POA: Diagnosis not present

## 2020-07-11 DIAGNOSIS — Z634 Disappearance and death of family member: Secondary | ICD-10-CM | POA: Diagnosis not present

## 2020-07-11 DIAGNOSIS — Z79899 Other long term (current) drug therapy: Secondary | ICD-10-CM | POA: Diagnosis not present
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
## 2020-07-14 ENCOUNTER — Other Ambulatory Visit: Payer: Self-pay

## 2020-07-14 ENCOUNTER — Ambulatory Visit (HOSPITAL_COMMUNITY): Payer: BC Managed Care – PPO

## 2020-07-14 ENCOUNTER — Encounter: Payer: Self-pay | Admitting: Family Medicine

## 2020-07-14 ENCOUNTER — Telehealth (INDEPENDENT_AMBULATORY_CARE_PROVIDER_SITE_OTHER): Payer: BC Managed Care – PPO | Admitting: Family Medicine

## 2020-07-14 DIAGNOSIS — F418 Other specified anxiety disorders: Secondary | ICD-10-CM

## 2020-07-14 DIAGNOSIS — G43719 Chronic migraine without aura, intractable, without status migrainosus: Secondary | ICD-10-CM

## 2020-07-14 NOTE — Progress Notes (Signed)
Virtual Visit via Video Note  I connected with Dawn Gray on 07/14/20 at 11:00 AM EST by a video enabled telemedicine application and verified that I am speaking with the correct person using two identifiers.  Location/ participants in visit  Patient: home with children  Provider: office   I discussed the limitations of evaluation and management by telemedicine and the availability of in person appointments. The patient expressed understanding and agreed to proceed.  History of Present Illness: Pt is home and finished IOP 07/11/2020 and will be in counseling Juliann Pulse collins every  week until [employer]Arch and starts tomorrow ---  And then it will be every other week    Observations/Objective: There were no vitals filed for this visit. Pt is in nad  Flat affect   Assessment and Plan: 1. Depression with anxiety F/u counselor weekly  con't lexapro and xanax  fmla filled out 2. Intractable chronic migraine without aura and without status migrainosus F/u neuro  fmla filled out     Follow Up Instructions:    I discussed the assessment and treatment plan with the patient. The patient was provided an opportunity to ask questions and all were answered. The patient agreed with the plan and demonstrated an understanding of the instructions.   The patient was advised to call back or seek an in-person evaluation if the symptoms worsen or if the condition fails to improve as anticipated.  I provided 25 minutes of non-face-to-face time during this encounter.   Ann Held, DO

## 2020-07-14 NOTE — Assessment & Plan Note (Signed)
Cont' lexapro and xanax con't counseling weekly per IOP fmla filled out

## 2020-07-14 NOTE — Telephone Encounter (Signed)
Printed

## 2020-07-14 NOTE — Assessment & Plan Note (Signed)
F/u neuro  fmla filled out

## 2020-07-15 ENCOUNTER — Ambulatory Visit (HOSPITAL_COMMUNITY): Payer: BC Managed Care – PPO

## 2020-07-15 ENCOUNTER — Ambulatory Visit (HOSPITAL_COMMUNITY): Payer: BC Managed Care – PPO | Admitting: Psychiatry

## 2020-07-15 ENCOUNTER — Other Ambulatory Visit: Payer: Self-pay

## 2020-07-16 ENCOUNTER — Ambulatory Visit (HOSPITAL_COMMUNITY): Payer: BC Managed Care – PPO

## 2020-07-17 ENCOUNTER — Ambulatory Visit (HOSPITAL_COMMUNITY): Payer: BC Managed Care – PPO

## 2020-07-18 ENCOUNTER — Ambulatory Visit (HOSPITAL_COMMUNITY): Payer: BC Managed Care – PPO

## 2020-07-21 ENCOUNTER — Ambulatory Visit (HOSPITAL_BASED_OUTPATIENT_CLINIC_OR_DEPARTMENT_OTHER)
Admission: RE | Admit: 2020-07-21 | Discharge: 2020-07-21 | Disposition: A | Discharge status: Home / Self Care | Payer: BC Managed Care – PPO | Source: Ambulatory Visit | Attending: Family Medicine | Admitting: Family Medicine

## 2020-07-21 ENCOUNTER — Encounter: Payer: Self-pay | Admitting: Family Medicine

## 2020-07-21 ENCOUNTER — Other Ambulatory Visit: Payer: Self-pay

## 2020-07-21 ENCOUNTER — Ambulatory Visit (INDEPENDENT_AMBULATORY_CARE_PROVIDER_SITE_OTHER): Payer: BC Managed Care – PPO | Admitting: Family Medicine

## 2020-07-21 ENCOUNTER — Other Ambulatory Visit (HOSPITAL_BASED_OUTPATIENT_CLINIC_OR_DEPARTMENT_OTHER): Payer: Self-pay | Admitting: Family Medicine

## 2020-07-21 VITALS — BP 100/68 | HR 79 | Temp 97.8°F | Resp 18 | Ht 64.0 in | Wt 170.8 lb

## 2020-07-21 DIAGNOSIS — Z1159 Encounter for screening for other viral diseases: Secondary | ICD-10-CM

## 2020-07-21 DIAGNOSIS — R21 Rash and other nonspecific skin eruption: Secondary | ICD-10-CM | POA: Diagnosis not present

## 2020-07-21 DIAGNOSIS — R3129 Other microscopic hematuria: Secondary | ICD-10-CM | POA: Diagnosis not present

## 2020-07-21 DIAGNOSIS — R350 Frequency of micturition: Secondary | ICD-10-CM

## 2020-07-21 DIAGNOSIS — Z1231 Encounter for screening mammogram for malignant neoplasm of breast: Secondary | ICD-10-CM

## 2020-07-21 DIAGNOSIS — Z87898 Personal history of other specified conditions: Secondary | ICD-10-CM

## 2020-07-21 DIAGNOSIS — Z0000 Encounter for general adult medical examination without abnormal findings: Secondary | ICD-10-CM | POA: Diagnosis not present

## 2020-07-21 LAB — POC URINALSYSI DIPSTICK (AUTOMATED)
Bilirubin, UA: NEGATIVE
Blood, UA: POSITIVE
Glucose, UA: NEGATIVE
Ketones, UA: POSITIVE
Leukocytes, UA: NEGATIVE
Nitrite, UA: NEGATIVE
Protein, UA: POSITIVE — AB
Spec Grav, UA: >=1.03 — AB (ref 1.010–1.025)
Urobilinogen, UA: 0.2 E.U./dL
pH, UA: 5 (ref 5.0–8.0)

## 2020-07-21 NOTE — Progress Notes (Signed)
Subjective:     Dawn Gray is a 45 y.o. female and is here for a comprehensive physical exam. The patient reports no new problems .  Social History   Socioeconomic History  . Marital status: Divorced    Spouse name: Not on file  . Number of children: 4  . Years of education: Not on file  . Highest education level: Some college, no degree  Occupational History  . Occupation: customer service  Tobacco Use  . Smoking status: Never Smoker  . Smokeless tobacco: Never Used  Vaping Use  . Vaping Use: Never used  Substance and Sexual Activity  . Alcohol use: Yes    Comment: occasional  . Drug use: No  . Sexual activity: Yes    Partners: Male    Birth control/protection: None  Other Topics Concern  . Not on file  Social History Narrative  . Not on file   Social Determinants of Health   Financial Resource Strain: Not on file  Food Insecurity: Not on file  Transportation Needs: Not on file  Physical Activity: Not on file  Stress: Not on file  Social Connections: Not on file  Intimate Partner Violence: Not on file   Health Maintenance  Topic Date Due  . Hepatitis C Screening  Never done  . INFLUENZA VACCINE  01/13/2020  . COVID-19 Vaccine (3 - Booster for Pfizer series) 08/11/2020  . PAP SMEAR-Modifier  03/21/2022  . TETANUS/TDAP  04/16/2026  . HIV Screening  Completed    The following portions of the patient's history were reviewed and updated as appropriate:  She  has a past medical history of Anemia, Bronchitis, Cat allergies, Environmental allergies, GERD (gastroesophageal reflux disease), Herpes, Lung tumor (01/2009), Migraine, and Panic attack. She does not have any pertinent problems on file. She  has a past surgical history that includes Tumor removal (01/2009). Her family history includes Anxiety disorder in her father; Arthritis in her mother and paternal grandmother; Cancer in her maternal grandfather; Diabetes in her father, mother, and paternal grandmother;  Berenice Primas' disease in her father; Heart disease in her maternal uncle; Hyperlipidemia in her father, maternal grandfather, paternal grandfather, and paternal grandmother; Hypertension in her father and mother; Lung cancer in her paternal grandmother; Stroke in her maternal grandmother and paternal grandmother. She  reports that she has never smoked. She has never used smokeless tobacco. She reports current alcohol use. She reports that she does not use drugs. She has a current medication list which includes the following prescription(s): alprazolam, amitriptyline, escitalopram, flintstones complete, and topiramate, and the following Facility-Administered Medications: paragard intrauterine copper. Current Outpatient Medications on File Prior to Visit  Medication Sig Dispense Refill  . ALPRAZolam (XANAX) 0.25 MG tablet Take 1 tablet (0.25 mg total) by mouth 2 (two) times daily as needed for anxiety. 30 tablet 2  . amitriptyline (ELAVIL) 10 MG tablet 1-2 po qhs prn sleep 60 tablet 2  . escitalopram (LEXAPRO) 20 MG tablet Take 1 tablet (20 mg total) by mouth daily. 90 tablet 3  . flintstones complete (FLINTSTONES) 60 MG chewable tablet Chew 1 tablet by mouth 2 (two) times daily.    Marland Kitchen topiramate (TOPAMAX) 25 MG tablet 1 po qhs x 1 week then 1 po bid x 1 week then 1 po q am and 2 po qpm 42 tablet 0   Current Facility-Administered Medications on File Prior to Visit  Medication Dose Route Frequency Provider Last Rate Last Admin  . PARAGARD INTRAUTERINE COPPER IUD   Intrauterine Once  Guss Bunde, MD       She has No Known Allergies..  Review of Systems Review of Systems  Constitutional: Negative for activity change, appetite change and fatigue.  HENT: Negative for hearing loss, congestion, tinnitus and ear discharge.  dentist q58m Eyes: Negative for visual disturbance (see optho q1y -- vision corrected to 20/20 with glasses).  Respiratory: Negative for cough, chest tightness and shortness of breath.    Cardiovascular: Negative for chest pain, palpitations and leg swelling.  Gastrointestinal: Negative for abdominal pain, diarrhea, constipation and abdominal distention.  Genitourinary: Negative for urgency, frequency, decreased urine volume and difficulty urinating.  Musculoskeletal: Negative for back pain, arthralgias and gait problem.  Skin: Negative for color change, pallor and rash.  Neurological: Negative for dizziness, light-headedness, numbness and headaches.  Hematological: Negative for adenopathy. Does not bruise/bleed easily.  Psychiatric/Behavioral: Negative for suicidal ideas, confusion, sleep disturbance, self-injury, dysphoric mood, decreased concentration and agitation.       Objective:    BP 100/68 (BP Location: Right Arm, Patient Position: Sitting, Cuff Size: Normal)   Pulse 79   Temp 97.8 F (36.6 C) (Oral)   Resp 18   Ht 5\' 4"  (1.626 m)   Wt 170 lb 12.8 oz (77.5 kg)   LMP 07/06/2020   SpO2 98%   BMI 29.32 kg/m  General appearance: alert, cooperative, appears stated age and no distress Head: Normocephalic, without obvious abnormality, atraumatic Eyes: negative findings: lids and lashes normal, conjunctivae and sclerae normal and pupils equal, round, reactive to light and accomodation Ears: normal TM's and external ear canals both ears Neck: no adenopathy, no carotid bruit, no JVD, supple, symmetrical, trachea midline and thyroid not enlarged, symmetric, no tenderness/mass/nodules Back: symmetric, no curvature. ROM normal. No CVA tenderness. Lungs: clear to auscultation bilaterally Breasts: normal appearance, no masses or tenderness Heart: regular rate and rhythm, S1, S2 normal, no murmur, click, rub or gallop Abdomen: soft, non-tender; bowel sounds normal; no masses,  no organomegaly Pelvic: deferred Extremities: extremities normal, atraumatic, no cyanosis or edema Pulses: 2+ and symmetric Skin: Skin color, texture, turgor normal. No rashes or lesions Lymph  nodes: Cervical, supraclavicular, and axillary nodes normal. Neurologic: Alert and oriented X 3, normal strength and tone. Normal symmetric reflexes. Normal coordination and gait    Assessment:    Healthy female exam.      Plan:    ghm utd Check labs  See After Visit Summary for Counseling Recommendations    1. Preventative health care See above  - Lipid panel - CBC with Differential/Platelet - TSH - Comprehensive metabolic panel - Vitamin X58 - Vitamin D (25 hydroxy)  2. Frequent urination Check urine  - POCT Urinalysis Dipstick (Automated)  3. Hx of chest pain   - DG Chest 2 View; Future  4. Need for hepatitis C screening test  - Hepatitis C antibody

## 2020-07-21 NOTE — Patient Instructions (Signed)
Preventive Care 84-43 Years Old, Female Preventive care refers to lifestyle choices and visits with your health care provider that can promote health and wellness. This includes:  A yearly physical exam. This is also called an annual wellness visit.  Regular dental and eye exams.  Immunizations.  Screening for certain conditions.  Healthy lifestyle choices, such as: ? Eating a healthy diet. ? Getting regular exercise. ? Not using drugs or products that contain nicotine and tobacco. ? Limiting alcohol use. What can I expect for my preventive care visit? Physical exam Your health care provider will check your:  Height and weight. These may be used to calculate your BMI (body mass index). BMI is a measurement that tells if you are at a healthy weight.  Heart rate and blood pressure.  Body temperature.  Skin for abnormal spots. Counseling Your health care provider may ask you questions about your:  Past medical problems.  Family's medical history.  Alcohol, tobacco, and drug use.  Emotional well-being.  Home life and relationship well-being.  Sexual activity.  Diet, exercise, and sleep habits.  Work and work Statistician.  Access to firearms.  Method of birth control.  Menstrual cycle.  Pregnancy history. What immunizations do I need? Vaccines are usually given at various ages, according to a schedule. Your health care provider will recommend vaccines for you based on your age, medical history, and lifestyle or other factors, such as travel or where you work.   What tests do I need? Blood tests  Lipid and cholesterol levels. These may be checked every 5 years, or more often if you are over 43 years old.  Hepatitis C test.  Hepatitis B test. Screening  Lung cancer screening. You may have this screening every year starting at age 43 if you have a 30-pack-year history of smoking and currently smoke or have quit within the past 15 years.  Colorectal cancer  screening. ? All adults should have this screening starting at age 43 and continuing until age 43. ? Your health care provider may recommend screening at age 43 if you are at increased risk. ? You will have tests every 1-10 years, depending on your results and the type of screening test.  Diabetes screening. ? This is done by checking your blood sugar (glucose) after you have not eaten for a while (fasting). ? You may have this done every 1-3 years.  Mammogram. ? This may be done every 1-2 years. ? Talk with your health care provider about when you should start having regular mammograms. This may depend on whether you have a family history of breast cancer.  BRCA-related cancer screening. This may be done if you have a family history of breast, ovarian, tubal, or peritoneal cancers.  Pelvic exam and Pap test. ? This may be done every 3 years starting at age 10. ? Starting at age 11, this may be done every 5 years if you have a Pap test in combination with an HPV test. Other tests  STD (sexually transmitted disease) testing, if you are at risk.  Bone density scan. This is done to screen for osteoporosis. You may have this scan if you are at high risk for osteoporosis. Talk with your health care provider about your test results, treatment options, and if necessary, the need for more tests. Follow these instructions at home: Eating and drinking  Eat a diet that includes fresh fruits and vegetables, whole grains, lean protein, and low-fat dairy products.  Take vitamin and mineral supplements  as recommended by your health care provider.  Do not drink alcohol if: ? Your health care provider tells you not to drink. ? You are pregnant, may be pregnant, or are planning to become pregnant.  If you drink alcohol: ? Limit how much you have to 0-1 drink a day. ? Be aware of how much alcohol is in your drink. In the [place] one drink equals one 12 oz bottle of beer (355 mL), one 5 oz glass of  wine (148 mL), or one 1 oz glass of hard liquor (44 mL).   Lifestyle  Take daily care of your teeth and gums. Brush your teeth every morning and night with fluoride toothpaste. Floss one time each day.  Stay active. Exercise for at least 30 minutes 5 or more days each week.  Do not use any products that contain nicotine or tobacco, such as cigarettes, e-cigarettes, and chewing tobacco. If you need help quitting, ask your health care provider.  Do not use drugs.  If you are sexually active, practice safe sex. Use a condom or other form of protection to prevent STIs (sexually transmitted infections).  If you do not wish to become pregnant, use a form of birth control. If you plan to become pregnant, see your health care provider for a prepregnancy visit.  If told by your health care provider, take low-dose aspirin daily starting at age 50.  Find healthy ways to cope with stress, such as: ? Meditation, yoga, or listening to music. ? Journaling. ? Talking to a trusted person. ? Spending time with friends and family. Safety  Always wear your seat belt while driving or riding in a vehicle.  Do not drive: ? If you have been drinking alcohol. Do not ride with someone who has been drinking. ? When you are tired or distracted. ? While texting.  Wear a helmet and other protective equipment during sports activities.  If you have firearms in your house, make sure you follow all gun safety procedures. What's next?  Visit your health care provider once a year for an annual wellness visit.  Ask your health care provider how often you should have your eyes and teeth checked.  Stay up to date on all vaccines. This information is not intended to replace advice given to you by your health care provider. Make sure you discuss any questions you have with your health care provider. Document Revised: 03/04/2020 Document Reviewed: 02/09/2018 Elsevier Patient Education  2021 [employer]  

## 2020-07-22 ENCOUNTER — Ambulatory Visit (HOSPITAL_BASED_OUTPATIENT_CLINIC_OR_DEPARTMENT_OTHER)
Admission: RE | Admit: 2020-07-22 | Discharge: 2020-07-22 | Disposition: A | Discharge status: Home / Self Care | Payer: BC Managed Care – PPO | Source: Ambulatory Visit | Attending: Family Medicine | Admitting: Family Medicine

## 2020-07-22 ENCOUNTER — Encounter (HOSPITAL_BASED_OUTPATIENT_CLINIC_OR_DEPARTMENT_OTHER): Payer: Self-pay

## 2020-07-22 DIAGNOSIS — Z1231 Encounter for screening mammogram for malignant neoplasm of breast: Secondary | ICD-10-CM | POA: Diagnosis not present

## 2020-07-22 LAB — CBC WITH DIFFERENTIAL/PLATELET
Basophils Absolute: 0.1 10*3/uL (ref 0.0–0.1)
Basophils Relative: 1.2 % (ref 0.0–3.0)
Eosinophils Absolute: 0.2 10*3/uL (ref 0.0–0.7)
Eosinophils Relative: 3.6 % (ref 0.0–5.0)
HCT: 35 % — ABNORMAL LOW (ref 36.0–46.0)
Hemoglobin: 11.4 g/dL — ABNORMAL LOW (ref 12.0–15.0)
Lymphocytes Relative: 32.5 % (ref 12.0–46.0)
Lymphs Abs: 2.1 10*3/uL (ref 0.7–4.0)
MCHC: 32.5 g/dL (ref 30.0–36.0)
MCV: 78.2 fl (ref 78.0–100.0)
Monocytes Absolute: 0.4 10*3/uL (ref 0.1–1.0)
Monocytes Relative: 7.1 % (ref 3.0–12.0)
Neutro Abs: 3.5 10*3/uL (ref 1.4–7.7)
Neutrophils Relative %: 55.6 % (ref 43.0–77.0)
Platelets: 291 10*3/uL (ref 150.0–400.0)
RBC: 4.47 Mil/uL (ref 3.87–5.11)
RDW: 15.2 % (ref 11.5–15.5)
WBC: 6.3 10*3/uL (ref 4.0–10.5)

## 2020-07-22 LAB — LIPID PANEL
Cholesterol: 240 mg/dL — ABNORMAL HIGH (ref 0–200)
HDL: 44.7 mg/dL (ref >39.00)
LDL Cholesterol: 170 mg/dL — ABNORMAL HIGH (ref 0–99)
NonHDL: 195.05
Total CHOL/HDL Ratio: 5
Triglycerides: 123 mg/dL (ref 0.0–149.0)
VLDL: 24.6 mg/dL (ref 0.0–40.0)

## 2020-07-22 LAB — HEPATITIS C ANTIBODY
Hepatitis C Ab: NONREACTIVE
SIGNAL TO CUT-OFF: 0.01 (ref <1.00)

## 2020-07-22 LAB — COMPREHENSIVE METABOLIC PANEL
ALT: 14 U/L (ref 0–35)
AST: 16 U/L (ref 0–37)
Albumin: 4.7 g/dL (ref 3.5–5.2)
Alkaline Phosphatase: 63 U/L (ref 39–117)
BUN: 13 mg/dL (ref 6–23)
CO2: 28 mEq/L (ref 19–32)
Calcium: 9.7 mg/dL (ref 8.4–10.5)
Chloride: 102 mEq/L (ref 96–112)
Creatinine, Ser: 0.84 mg/dL (ref 0.40–1.20)
GFR: 85.67 mL/min (ref >60.00)
Glucose, Bld: 75 mg/dL (ref 70–99)
Potassium: 3.7 mEq/L (ref 3.5–5.1)
Sodium: 136 mEq/L (ref 135–145)
Total Bilirubin: 0.6 mg/dL (ref 0.2–1.2)
Total Protein: 8.3 g/dL (ref 6.0–8.3)

## 2020-07-22 LAB — TSH: TSH: 1.7 u[IU]/mL (ref 0.35–4.50)

## 2020-07-22 LAB — VITAMIN B12: Vitamin B-12: 579 pg/mL (ref 211–911)

## 2020-07-22 LAB — VITAMIN D 25 HYDROXY (VIT D DEFICIENCY, FRACTURES): VITD: 15.61 ng/mL — ABNORMAL LOW (ref 30.00–100.00)

## 2020-07-22 NOTE — Addendum Note (Signed)
Addended by: Kelle Darting A on: 07/22/2020 08:53 AM   Modules accepted: Orders

## 2020-07-23 ENCOUNTER — Ambulatory Visit (INDEPENDENT_AMBULATORY_CARE_PROVIDER_SITE_OTHER): Payer: BC Managed Care – PPO | Admitting: Professional

## 2020-07-23 ENCOUNTER — Other Ambulatory Visit: Payer: Self-pay

## 2020-07-23 DIAGNOSIS — F332 Major depressive disorder, recurrent severe without psychotic features: Secondary | ICD-10-CM

## 2020-07-23 MED ORDER — VITAMIN D (ERGOCALCIFEROL) 1.25 MG (50000 UNIT) PO CAPS: 50000.0000 [IU] | ORAL_CAPSULE | ORAL | 2 refills | Status: DC

## 2020-07-24 LAB — URINE CULTURE
MICRO NUMBER:: 11508280
Result:: NO GROWTH
SPECIMEN QUALITY:: ADEQUATE

## 2020-07-29 NOTE — Telephone Encounter (Signed)
Note is in mychart

## 2020-07-29 NOTE — Telephone Encounter (Signed)
I have not was she going to send [place] something --- does the pt think she can do lightl duty 1/2 day on phone and 1/2 day off ?

## 2020-07-31 ENCOUNTER — Ambulatory Visit: Payer: BC Managed Care – PPO | Admitting: Professional

## 2020-08-04 NOTE — Telephone Encounter (Signed)
Ok  ---- light duty starting Feb 21 and how about March 21 for full time?------I have not received anything from the counselor

## 2020-08-05 NOTE — Telephone Encounter (Signed)
Please call her---- she wants to go back part time in April? And full time May?

## 2020-08-08 ENCOUNTER — Ambulatory Visit: Payer: BC Managed Care – PPO | Admitting: Professional

## 2020-08-11 ENCOUNTER — Ambulatory Visit (INDEPENDENT_AMBULATORY_CARE_PROVIDER_SITE_OTHER): Payer: BC Managed Care – PPO | Admitting: Professional

## 2020-08-11 DIAGNOSIS — F332 Major depressive disorder, recurrent severe without psychotic features: Secondary | ICD-10-CM

## 2020-08-21 ENCOUNTER — Ambulatory Visit: Payer: BC Managed Care – PPO | Admitting: Professional

## 2020-08-26 ENCOUNTER — Ambulatory Visit: Payer: BC Managed Care – PPO | Admitting: Professional

## 2020-09-02 ENCOUNTER — Ambulatory Visit: Payer: BC Managed Care – PPO | Admitting: Professional

## 2020-09-09 ENCOUNTER — Ambulatory Visit: Payer: BC Managed Care – PPO | Admitting: Professional

## 2020-09-16 ENCOUNTER — Ambulatory Visit: Payer: BC Managed Care – PPO | Admitting: Professional

## 2020-10-20 ENCOUNTER — Ambulatory Visit: Payer: BC Managed Care – PPO | Admitting: Family Medicine

## 2020-10-27 ENCOUNTER — Ambulatory Visit (INDEPENDENT_AMBULATORY_CARE_PROVIDER_SITE_OTHER): Payer: BC Managed Care – PPO | Admitting: Family Medicine

## 2020-10-27 ENCOUNTER — Other Ambulatory Visit: Payer: Self-pay

## 2020-10-27 ENCOUNTER — Encounter: Payer: Self-pay | Admitting: Family Medicine

## 2020-10-27 VITALS — BP 120/78 | HR 88 | Temp 99.0°F | Resp 18 | Ht 64.0 in | Wt 172.2 lb

## 2020-10-27 DIAGNOSIS — G4700 Insomnia, unspecified: Secondary | ICD-10-CM | POA: Diagnosis not present

## 2020-10-27 DIAGNOSIS — M25531 Pain in right wrist: Secondary | ICD-10-CM

## 2020-10-27 DIAGNOSIS — F419 Anxiety disorder, unspecified: Secondary | ICD-10-CM

## 2020-10-27 DIAGNOSIS — Z79899 Other long term (current) drug therapy: Secondary | ICD-10-CM

## 2020-10-27 DIAGNOSIS — F418 Other specified anxiety disorders: Secondary | ICD-10-CM

## 2020-10-27 DIAGNOSIS — G44221 Chronic tension-type headache, intractable: Secondary | ICD-10-CM

## 2020-10-27 MED ORDER — ALPRAZOLAM 0.25 MG PO TABS
0.2500 mg | ORAL_TABLET | Freq: Two times a day (BID) | ORAL | 2 refills | Status: AC | PRN
Start: 2020-10-27 — End: ?

## 2020-10-27 MED ORDER — AMITRIPTYLINE HCL 25 MG PO TABS: 25.0000 mg | ORAL_TABLET | Freq: Every day | ORAL | 1 refills | Status: DC

## 2020-10-27 NOTE — Assessment & Plan Note (Signed)
Amitriptyline 25 mg qhs F/u neuro

## 2020-10-27 NOTE — Patient Instructions (Signed)
[email or web address]">  Generalized Anxiety Disorder, Adult Generalized anxiety disorder (GAD) is a mental health condition. Unlike normal worries, anxiety related to GAD is not triggered by a specific event. These worries do not fade or get better with time. GAD interferes with relationships, work, and school. GAD symptoms can vary from mild to severe. People with severe GAD can have intense waves of anxiety with physical symptoms that are similar to panic attacks. What are the causes? The exact cause of GAD is not known, but the following are believed to have an impact:  Differences in natural brain chemicals.  Genes passed down from parents to children.  Differences in the way threats are perceived.  Development during childhood.  Personality. What increases the risk? The following factors may make you more likely to develop this condition:  Being female.  Having a family history of anxiety disorders.  Being very shy.  Experiencing very stressful life events, such as the death of a loved one.  Having a very stressful family environment. What are the signs or symptoms? People with GAD often worry excessively about many things in their lives, such as their health and family. Symptoms may also include:  Mental and emotional symptoms: ? Worrying excessively about natural disasters. ? Fear of being late. ? Difficulty concentrating. ? Fears that others are judging your performance.  Physical symptoms: ? Fatigue. ? Headaches, muscle tension, muscle twitches, trembling, or feeling shaky. ? Feeling like your heart is pounding or beating very fast. ? Feeling out of breath or like you cannot take a deep breath. ? Having trouble falling asleep or staying asleep, or experiencing restlessness. ? Sweating. ? Nausea, diarrhea, or irritable bowel syndrome (IBS).  Behavioral symptoms: ? Experiencing erratic moods or irritability. ? Avoidance of new situations. ? Avoidance of  people. ? Extreme difficulty making decisions. How is this diagnosed? This condition is diagnosed based on your symptoms and medical history. You will also have a physical exam. Your health care provider may perform tests to rule out other possible causes of your symptoms. To be diagnosed with GAD, a person must have anxiety that:  Is out of his or her control.  Affects several different aspects of his or her life, such as work and relationships.  Causes distress that makes him or her unable to take part in normal activities.  Includes at least three symptoms of GAD, such as restlessness, fatigue, trouble concentrating, irritability, muscle tension, or sleep problems. Before your health care provider can confirm a diagnosis of GAD, these symptoms must be present more days than they are not, and they must last for 6 months or longer. How is this treated? This condition may be treated with:  Medicine. Antidepressant medicine is usually prescribed for long-term daily control. Anti-anxiety medicines may be added in severe cases, especially when panic attacks occur.  Talk therapy (psychotherapy). Certain types of talk therapy can be helpful in treating GAD by providing support, education, and guidance. Options include: ? Cognitive behavioral therapy (CBT). People learn coping skills and self-calming techniques to ease their physical symptoms. They learn to identify unrealistic thoughts and behaviors and to replace them with more appropriate thoughts and behaviors. ? Acceptance and commitment therapy (ACT). This treatment teaches people how to be mindful as a way to cope with unwanted thoughts and feelings. ? Biofeedback. This process trains you to manage your body's response (physiological response) through breathing techniques and relaxation methods. You will work with a therapist while machines are used to monitor your physical   symptoms.  Stress management techniques. These include yoga,  meditation, and exercise. A mental health specialist can help determine which treatment is best for you. Some people see improvement with one type of therapy. However, other people require a combination of therapies.   Follow these instructions at home: Lifestyle  Maintain a consistent routine and schedule.  Anticipate stressful situations. Create a plan, and allow extra time to work with your plan.  Practice stress management or self-calming techniques that you have learned from your therapist or your health care provider. General instructions  Take over-the-counter and prescription medicines only as told by your health care provider.  Understand that you are likely to have setbacks. Accept this and be kind to yourself as you persist to take better care of yourself.  Recognize and accept your accomplishments, even if you judge them as small.  Keep all follow-up visits as told by your health care provider. This is important. Contact a health care provider if:  Your symptoms do not get better.  Your symptoms get worse.  You have signs of depression, such as: ? A persistently sad or irritable mood. ? Loss of enjoyment in activities that used to bring you joy. ? Change in weight or eating. ? Changes in sleeping habits. ? Avoiding friends or family members. ? Loss of energy for normal tasks. ? Feelings of guilt or worthlessness. Get help right away if:  You have serious thoughts about hurting yourself or others. If you ever feel like you may hurt yourself or others, or have thoughts about taking your own life, get help right away. Go to your nearest emergency department or:  Call your local emergency services (911 in the [place]  Call a suicide crisis helpline, such as the National Suicide Prevention Lifeline at 1-800-273-8255. This is open 24 hours a day in the [place]  Text the Crisis Text Line at 741741 (in the [place] Summary  Generalized anxiety disorder (GAD) is a mental  health condition that involves worry that is not triggered by a specific event.  People with GAD often worry excessively about many things in their lives, such as their health and family.  GAD may cause symptoms such as restlessness, trouble concentrating, sleep problems, frequent sweating, nausea, diarrhea, headaches, and trembling or muscle twitching.  A mental health specialist can help determine which treatment is best for you. Some people see improvement with one type of therapy. However, other people require a combination of therapies. This information is not intended to replace advice given to you by your health care provider. Make sure you discuss any questions you have with your health care provider. Document Revised: 03/21/2019 Document Reviewed: 03/21/2019 Elsevier Patient Education  2021 Elsevier Inc.  

## 2020-10-27 NOTE — Assessment & Plan Note (Signed)
[place] amitriptyline to 25 mg at night  Refer to psych ----  Neuro d/x lexapro and started amitriptyline  F/u 1 month

## 2020-10-27 NOTE — Progress Notes (Signed)
Patient ID: Dawn Gray, female    DOB: 08/23/1975  Age: 45 y.o. MRN: [id number]    Subjective:  Subjective  HPI Dawn Gray presents for an office visit today. She complains of anxiety, depression, and HA. She states that she has not been taking 20 mg Lexapro PO Daily and 0.25 Xanax PO Daily for her Dx of depression and anxiety, since it didn't relieve her symptoms. She states that she is taking 10 mg Elavil PO Daily to help her sleep. She also complains of constant R wrist pain x5 days. She reports that she has not experienced any injures or falls. She states the pain worsen when she flexes her wrist and she she pushes down using her R hand. She denies any chest pain, SOB, fever, abdominal pain, cough, chills, sore throat, dysuria, urinary incontinence, back pain, or N/V/D at this time.   Review of Systems  Constitutional: Negative for chills, fatigue and fever.  HENT: Negative for ear pain, rhinorrhea, sinus pressure, sinus pain, sore throat and tinnitus.   Eyes: Negative for pain.  Respiratory: Negative for cough, shortness of breath and wheezing.   Cardiovascular: Negative for chest pain.  Gastrointestinal: Negative for abdominal pain, anal bleeding, constipation, diarrhea, nausea and vomiting.  Genitourinary: Negative for flank pain.  Musculoskeletal: Positive for arthralgias (R wrist). Negative for back pain and neck pain.          Skin: Negative for rash.  Neurological: Positive for headaches. Negative for seizures, weakness, light-headedness and numbness.  Psychiatric/Behavioral: Negative for self-injury and suicidal ideas. The patient is nervous/anxious.        (+) depression     History Past Medical History:  Diagnosis Date  . Anemia   . Bronchitis    Hx of Chronic  . Cat allergies   . Environmental allergies    seasonal  . GERD (gastroesophageal reflux disease)   . 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  Migraine   . Panic attack     She has a past surgical history that includes Tumor removal (01/2009).   Her family history includes Anxiety disorder in her father; Arthritis in her mother and paternal grandmother; Cancer in her maternal grandfather; Diabetes in her father, mother, and paternal grandmother; Berenice Primas' disease in her father; Heart disease in her maternal uncle; Hyperlipidemia in her father, maternal grandfather, paternal grandfather, and paternal grandmother; Hypertension in her father and mother; Lung cancer in her paternal grandmother; Stroke in her maternal grandmother and paternal grandmother.She reports that she has never smoked. She has never used smokeless tobacco. She reports current alcohol use. She reports that she does not use drugs.  Current Outpatient Medications on File Prior to Visit  Medication Sig Dispense Refill  . flintstones complete (FLINTSTONES) 60 MG chewable tablet Chew 1 tablet by mouth 2 (two) times daily.    . Vitamin D, Ergocalciferol, (DRISDOL) 1.25 MG (50000 UNIT) CAPS capsule Take 1 capsule (50,000 Units total) by mouth every 7 (seven) days. 12 capsule 2   Current Facility-Administered Medications on File Prior to Visit  Medication Dose Route Frequency Provider Last Rate Last Admin  . [place] INTRAUTERINE COPPER IUD   Intrauterine Once Guss Bunde, MD         Objective:  Objective  Physical Exam Vitals and nursing note reviewed.  Constitutional:      General: She is not in acute distress.    Appearance:  Normal appearance. She is well-developed. She is not ill-appearing.  HENT:     Head: Normocephalic and atraumatic.     Right Ear: External ear normal.     Left Ear: External ear normal.     Nose: Nose normal.  Eyes:     General:        Right eye: No discharge.        Left eye: No discharge.     Extraocular Movements: Extraocular movements intact.     Pupils: Pupils are equal, round, and reactive to light.  Cardiovascular:     Rate and  Rhythm: Normal rate and regular rhythm.     Pulses: Normal pulses.     Heart sounds: Normal heart sounds. No murmur heard. No friction rub. No gallop.   Pulmonary:     Effort: Pulmonary effort is normal. No respiratory distress.     Breath sounds: Normal breath sounds. No stridor. No wheezing, rhonchi or rales.  Chest:     Chest wall: No tenderness.  Abdominal:     General: Bowel sounds are normal. There is no distension.     Palpations: Abdomen is soft. There is no mass.     Tenderness: There is no abdominal tenderness. There is no guarding or rebound.     Hernia: No hernia is present.  Musculoskeletal:        General: Tenderness present. Normal range of motion.     Right wrist: Tenderness present.     Left wrist: Normal.     Left hand: Normal.     Cervical back: Normal range of motion and neck supple.     Right lower leg: No edema.     Left lower leg: No edema.     Comments: There is tenderness present in the right dorsal wrist with palpitation, rotation, flexion, and extension.   Skin:    General: Skin is warm and dry.  Neurological:     Mental Status: She is alert and oriented to person, place, and time.  Psychiatric:        Mood and Affect: Mood is anxious and depressed.        Speech: Speech normal.        Behavior: Behavior normal.        Thought Content: Thought content normal. Thought content is not paranoid. Thought content does not include homicidal or suicidal ideation. Thought content does not include homicidal or suicidal plan.        Cognition and Memory: Cognition normal.    BP 120/78 (BP Location: Right Arm, Patient Position: Sitting, Cuff Size: Normal)   Pulse 88   Temp 99 F (37.2 C) (Oral)   Resp 18   Ht 5\' 4"  (1.626 m)   Wt 172 lb 3.2 oz (78.1 kg)   SpO2 98%   BMI 29.56 kg/m  Wt Readings from Last 3 Encounters:  10/27/20 172 lb 3.2 oz (78.1 kg)  07/21/20 170 lb 12.8 oz (77.5 kg)  03/21/20 155 lb (70.3 kg)     Lab Results  Component Value Date    WBC 6.3 07/21/2020   HGB 11.4 (L) 07/21/2020   HCT 35.0 (L) 07/21/2020   PLT 291.0 07/21/2020   GLUCOSE 75 07/21/2020   CHOL 240 (H) 07/21/2020   TRIG 123.0 07/21/2020   HDL 44.70 07/21/2020   LDLCALC 170 (H) 07/21/2020   ALT 14 07/21/2020   AST 16 07/21/2020   NA 136 07/21/2020   K 3.7 07/21/2020   CL 102 07/21/2020  CREATININE 0.84 07/21/2020   BUN 13 07/21/2020   CO2 28 07/21/2020   TSH 1.70 07/21/2020   INR 0.93 06/24/2011   HGBA1C 5.9 03/22/2019    MM 3D SCREEN BREAST BILATERAL  Result Date: 07/24/2020 CLINICAL DATA:  Screening. EXAM: DIGITAL SCREENING BILATERAL MAMMOGRAM WITH TOMOSYNTHESIS AND CAD TECHNIQUE: Bilateral screening digital craniocaudal and mediolateral oblique mammograms were obtained. Bilateral screening digital breast tomosynthesis was performed. The images were evaluated with computer-aided detection. COMPARISON:  None. ACR Breast Density Category b: There are scattered areas of fibroglandular density. FINDINGS: There are no findings suspicious for malignancy. IMPRESSION: No mammographic evidence of malignancy. A result letter of this screening mammogram will be mailed directly to the patient. RECOMMENDATION: Screening mammogram in one year. (Code:SM-B-01Y) BI-RADS CATEGORY  1: Negative. Electronically Signed   By: Abelardo Diesel M.D.   On: 07/24/2020 14:50     Assessment & Plan:  Plan    Meds ordered this encounter  Medications  . ALPRAZolam (XANAX) 0.25 MG tablet    Sig: Take 1 tablet (0.25 mg total) by mouth 2 (two) times daily as needed for anxiety.    Dispense:  30 tablet    Refill:  2  . amitriptyline (ELAVIL) 25 MG tablet    Sig: Take 1 tablet (25 mg total) by mouth at bedtime.    Dispense:  90 tablet    Refill:  1    Problem List Items Addressed This Visit      Unprioritized   Anxiety - Primary    [place] amitriptyline to 25 mg at night  Refer to psych ----  Neuro d/x lexapro and started amitriptyline  F/u 1 month      Relevant  Medications   ALPRAZolam (XANAX) 0.25 MG tablet   amitriptyline (ELAVIL) 25 MG tablet   Depression with anxiety    [place] amitriptyline to 25 mg at night  Refer to psych ----  Neuro d/x lexapro and started amitriptyline  F/u 1 month       Relevant Medications   ALPRAZolam (XANAX) 0.25 MG tablet   amitriptyline (ELAVIL) 25 MG tablet   Headache    Amitriptyline 25 mg qhs F/u neuro       Relevant Medications   amitriptyline (ELAVIL) 25 MG tablet   Right wrist pain    Rest Splint  advil prn  Refer to sport med if no better        Other Visit Diagnoses    Insomnia, unspecified type       Relevant Medications   amitriptyline (ELAVIL) 25 MG tablet   High risk medication use       Relevant Orders   DRUG MONITORING, PANEL 8 WITH CONFIRMATION, URINE      Follow-up: Return in about 4 weeks (around 11/24/2020), or if symptoms worsen or fail to improve.   I,Gordon Zheng,acting as a Education administrator for [employer], DO.,have documented all relevant documentation on the behalf of Dawn Held, DO,as directed by  Dawn Held, DO while in the presence of [place], DO, have reviewed all documentation for this visit. The documentation on 10/27/20 for the exam, diagnosis, procedures, and orders are all accurate and complete. Dawn Held, DO

## 2020-10-27 NOTE — Assessment & Plan Note (Signed)
Rest Splint  advil prn  Refer to sport med if no better

## 2020-10-27 NOTE — Assessment & Plan Note (Signed)
[place] amitriptyline to 25 mg at night  Refer to psych ----  Neuro d/x lexapro and started amitriptyline  F/u 1 month

## 2020-10-28 LAB — DRUG MONITORING, PANEL 8 WITH CONFIRMATION, URINE
6 Acetylmorphine: NEGATIVE ng/mL (ref <10)
Alcohol Metabolites: NEGATIVE ng/mL
Amphetamines: NEGATIVE ng/mL (ref <500)
Benzodiazepines: NEGATIVE ng/mL (ref <100)
Buprenorphine, Urine: NEGATIVE ng/mL (ref <5)
Cocaine Metabolite: NEGATIVE ng/mL (ref <150)
Creatinine: 272.8 mg/dL
MDMA: NEGATIVE ng/mL (ref <500)
Marijuana Metabolite: NEGATIVE ng/mL (ref <20)
Opiates: NEGATIVE ng/mL (ref <100)
Oxidant: NEGATIVE ug/mL
Oxycodone: NEGATIVE ng/mL (ref <100)
pH: 5.2 (ref 4.5–9.0)

## 2020-10-28 LAB — DM TEMPLATE

## 2020-11-24 ENCOUNTER — Telehealth (INDEPENDENT_AMBULATORY_CARE_PROVIDER_SITE_OTHER): Payer: BC Managed Care – PPO | Admitting: Family Medicine

## 2020-11-24 ENCOUNTER — Encounter: Payer: Self-pay | Admitting: Family Medicine

## 2020-11-24 ENCOUNTER — Other Ambulatory Visit: Payer: Self-pay

## 2020-11-24 DIAGNOSIS — F418 Other specified anxiety disorders: Secondary | ICD-10-CM

## 2020-11-24 NOTE — Progress Notes (Signed)
MyChart Video Visit    Virtual Visit via Video Note   This visit type was conducted due to national recommendations for restrictions regarding the COVID-19 Pandemic (e.g. social distancing) in an effort to limit this patient's exposure and mitigate transmission in our community. This patient is at least at moderate risk for complications without adequate follow up. This format is felt to be most appropriate for this patient at this time. Physical exam was limited by quality of the video and audio technology used for the visit. Alinda Dooms was able to get the patient set up on a video visit.  Patient location: Home Patient and provider in visit Provider location: Office  I discussed the limitations of evaluation and management by telemedicine and the availability of in person appointments. The patient expressed understanding and agreed to proceed.  Visit Date: 11/24/2020  Today's healthcare provider: Ann Held, DO     Subjective:    Patient ID: Dawn Gray, female    DOB: 16-Apr-1976, 45 y.o.   MRN: [id number]  No chief complaint on file.   HPI Patient is in today for f/u anxiety / depression---  she is better with the amitriptyline increase but had and episode of anxiety that she is trying to get over.  She still has not made an app with psych.    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   Migraine    Panic attack     Past Surgical History:  Procedure Laterality Date   TUMOR REMOVAL  01/2009   From Lung and Heart    Family History  Problem Relation Age of Onset   Hypertension Mother    Diabetes Mother    Arthritis Mother    Diabetes Father    Hypertension Father    Hyperlipidemia Father    Graves' disease Father     Anxiety disorder Father    Arthritis Paternal Grandmother    Hyperlipidemia Paternal Grandmother    Stroke Paternal Grandmother    Lung cancer Paternal Grandmother        brain mets   Diabetes Paternal Grandmother    Hyperlipidemia Maternal Grandfather    Cancer Maternal Grandfather        unsure what kind   Hyperlipidemia Paternal Grandfather    Heart disease Maternal Uncle    Stroke Maternal Grandmother    Colon cancer Neg Hx     Social History   Socioeconomic History   Marital status: Divorced    Spouse name: Not on file   Number of children: 4   Years of education: Not on file   Highest education level: Some college, no degree  Occupational History   Occupation: customer service  Tobacco Use   Smoking status: Never   Smokeless tobacco: Never  Vaping Use   Vaping Use: Never used  Substance and Sexual Activity   Alcohol use: Yes    Comment: occasional   Drug use: No   Sexual activity: Yes    Partners: Male    Birth control/protection: None  Other Topics Concern   Not on file  Social History Narrative   Not on file   Social Determinants of  Health   Financial Resource Strain: Not on file  Food Insecurity: Not on file  Transportation Needs: Not on file  Physical Activity: Not on file  Stress: Not on file  Social Connections: Not on file  Intimate Partner Violence: Not on file    Outpatient Medications Prior to Visit  Medication Sig Dispense Refill   ALPRAZolam (XANAX) 0.25 MG tablet Take 1 tablet (0.25 mg total) by mouth 2 (two) times daily as needed for anxiety. 30 tablet 2   amitriptyline (ELAVIL) 25 MG tablet Take 1 tablet (25 mg total) by mouth at bedtime. 90 tablet 1   flintstones complete (FLINTSTONES) 60 MG chewable tablet Chew 1 tablet by mouth 2 (two) times daily.     Vitamin D, Ergocalciferol, (DRISDOL) 1.25 MG (50000 UNIT) CAPS capsule Take 1 capsule (50,000 Units total) by mouth every 7 (seven) days. 12 capsule 2   Facility-Administered  Medications Prior to Visit  Medication Dose Route Frequency Provider Last Rate Last Admin   [place] IUD   Intrauterine Once Guss Bunde, MD        No Known Allergies  Review of Systems  Constitutional:  Negative for fever.  HENT:  Negative for congestion, hearing loss, sinus pain, sore throat and tinnitus.   Eyes:  Negative for pain, discharge and redness.  Respiratory:  Negative for shortness of breath and wheezing.   Gastrointestinal:  Negative for abdominal pain, blood in stool, constipation, diarrhea, nausea and vomiting.  Genitourinary:  Negative for flank pain, frequency, hematuria and urgency.  Neurological:  Positive for headaches. Negative for dizziness, tremors, focal weakness, seizures and weakness.  Psychiatric/Behavioral:  Positive for depression. The patient is nervous/anxious and has insomnia.       Objective:    Physical Exam Constitutional:      General: She is not in acute distress.    Appearance: Normal appearance. She is not ill-appearing.  HENT:     Head: Normocephalic and atraumatic.  Pulmonary:     Effort: No respiratory distress.  Neurological:     Mental Status: She is alert and oriented to person, place, and time.     Comments: Headaches have improved since inc the amitriptyline   Psychiatric:        Mood and Affect: Mood is anxious and depressed. Affect is blunt.        Behavior: Behavior normal.        Thought Content: Thought content normal.    There were no vitals taken for this visit. Wt Readings from Last 3 Encounters:  10/27/20 172 lb 3.2 oz (78.1 kg)  07/21/20 170 lb 12.8 oz (77.5 kg)  03/21/20 155 lb (70.3 kg)    Diabetic Foot Exam - Simple   No data filed    Lab Results  Component Value Date   WBC 6.3 07/21/2020   HGB 11.4 (L) 07/21/2020   HCT 35.0 (L) 07/21/2020   PLT 291.0 07/21/2020   GLUCOSE 75 07/21/2020   CHOL 240 (H) 07/21/2020   TRIG 123.0 07/21/2020   HDL 44.70 07/21/2020   LDLCALC 170 (H)  07/21/2020   ALT 14 07/21/2020   AST 16 07/21/2020   NA 136 07/21/2020   K 3.7 07/21/2020   CL 102 07/21/2020   CREATININE 0.84 07/21/2020   BUN 13 07/21/2020   CO2 28 07/21/2020   TSH 1.70 07/21/2020   INR 0.93 06/24/2011   HGBA1C 5.9 03/22/2019    Lab Results  Component Value Date   TSH 1.70 07/21/2020  Lab Results  Component Value Date   WBC 6.3 07/21/2020   HGB 11.4 (L) 07/21/2020   HCT 35.0 (L) 07/21/2020   MCV 78.2 07/21/2020   PLT 291.0 07/21/2020   Lab Results  Component Value Date   NA 136 07/21/2020   K 3.7 07/21/2020   CO2 28 07/21/2020   GLUCOSE 75 07/21/2020   BUN 13 07/21/2020   CREATININE 0.84 07/21/2020   BILITOT 0.6 07/21/2020   ALKPHOS 63 07/21/2020   AST 16 07/21/2020   ALT 14 07/21/2020   PROT 8.3 07/21/2020   ALBUMIN 4.7 07/21/2020   CALCIUM 9.7 07/21/2020   GFR 85.67 07/21/2020   Lab Results  Component Value Date   CHOL 240 (H) 07/21/2020   Lab Results  Component Value Date   HDL 44.70 07/21/2020   Lab Results  Component Value Date   LDLCALC 170 (H) 07/21/2020   Lab Results  Component Value Date   TRIG 123.0 07/21/2020   Lab Results  Component Value Date   CHOLHDL 5 07/21/2020   Lab Results  Component Value Date   HGBA1C 5.9 03/22/2019       Assessment & Plan:   Problem List Items Addressed This Visit       Unprioritized   Depression with anxiety - Primary    con't amitriptyline and xanax Pt will call psych for app  fmla paperwork to be faxed        Relevant Orders   Ambulatory referral to Psychiatry      No orders of the defined types were placed in this encounter.   I discussed the assessment and treatment plan with the patient. The patient was provided an opportunity to ask questions and all were answered. The patient agreed with the plan and demonstrated an understanding of the instructions.   The patient was advised to call back or seek an in-person evaluation if the symptoms worsen or if the  condition fails to improve as anticipated.    Ann Held, DO [place] [facility] [phone] (phone) [phone] (fax)  [place]

## 2020-11-24 NOTE — Assessment & Plan Note (Signed)
con't amitriptyline and xanax Pt will call psych for app  fmla paperwork to be faxed

## 2021-03-27 ENCOUNTER — Encounter: Payer: Self-pay | Admitting: Family Medicine

## 2021-03-27 ENCOUNTER — Other Ambulatory Visit: Payer: Self-pay

## 2021-03-27 ENCOUNTER — Telehealth (INDEPENDENT_AMBULATORY_CARE_PROVIDER_SITE_OTHER): Payer: BC Managed Care – PPO | Admitting: Family Medicine

## 2021-03-27 DIAGNOSIS — F419 Anxiety disorder, unspecified: Secondary | ICD-10-CM

## 2021-03-27 DIAGNOSIS — F418 Other specified anxiety disorders: Secondary | ICD-10-CM | POA: Diagnosis not present

## 2021-03-27 MED ORDER — ESCITALOPRAM OXALATE 10 MG PO TABS: 10.0000 mg | ORAL_TABLET | Freq: Every day | ORAL | 2 refills | Status: DC

## 2021-03-27 NOTE — Progress Notes (Signed)
MyChart Video Visit    Virtual Visit via Video Note   This visit type was conducted due to national recommendations for restrictions regarding the COVID-19 Pandemic (e.g. social distancing) in an effort to limit this patient's exposure and mitigate transmission in our community. This patient is at least at moderate risk for complications without adequate follow up. This format is felt to be most appropriate for this patient at this time. Physical exam was limited by quality of the video and audio technology used for the visit. Alinda Dooms was able to get the patient set up on a video visit.  Patient location: Home Patient and provider in visit Provider location: Office  I discussed the limitations of evaluation and management by telemedicine and the availability of in person appointments. The patient expressed understanding and agreed to proceed.  Visit Date: 03/27/2021  Today's healthcare provider: Ann Held, DO      Subjective:    Patient ID: Dawn Gray, female    DOB: 09-09-1975, 45 y.o.   MRN: [id number]  Chief Complaint  Patient presents with   Depression   Anxiety   Follow-up    HPI Patient is in today for a video visit and a f/u on depression and anxiety.  She reports she is not doing well. She has been feeling very depressed for the past 2 months and the last few days have been worse. She has been going to a licensed mental health therapist for the past month and mentions it helps a little. She uses 25 mg elavil to help with sleep at night but has nothing to get her through the day.  She has not been to work in the past 3 days and would like a note for work.   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   Migraine     Panic attack     Past Surgical History:  Procedure Laterality Date   TUMOR REMOVAL  01/2009   From Lung and Heart    Family History  Problem Relation Age of Onset   Hypertension Mother    Diabetes Mother    Arthritis Mother    Diabetes Father    Hypertension Father    Hyperlipidemia Father    Graves' disease Father    Anxiety disorder Father    Arthritis Paternal Grandmother    Hyperlipidemia Paternal Grandmother    Stroke Paternal Grandmother    Lung cancer Paternal Grandmother        brain mets   Diabetes Paternal Grandmother    Hyperlipidemia Maternal Grandfather    Cancer Maternal Grandfather        unsure what kind   Hyperlipidemia Paternal Grandfather    Heart disease Maternal Uncle    Stroke Maternal Grandmother    Colon cancer Neg Hx     Social History   Socioeconomic History   Marital status: Divorced    Spouse name: Not on file   Number of children: 4   Years of education: Not on file   Highest education level: Some college, no degree  Occupational History   Occupation: customer service  Tobacco Use   Smoking status: Never  Smokeless tobacco: Never  Vaping Use   Vaping Use: Never used  Substance and Sexual Activity   Alcohol use: Yes    Comment: occasional   Drug use: No   Sexual activity: Yes    Partners: Male    Birth control/protection: None  Other Topics Concern   Not on file  Social History Narrative   Not on file   Social Determinants of Health   Financial Resource Strain: Not on file  Food Insecurity: Not on file  Transportation Needs: Not on file  Physical Activity: Not on file  Stress: Not on file  Social Connections: Not on file  Intimate Partner Violence: Not on file    Outpatient Medications Prior to Visit  Medication Sig Dispense Refill   ALPRAZolam (XANAX) 0.25 MG tablet Take 1 tablet (0.25 mg total) by mouth 2 (two) times daily as needed for anxiety. 30 tablet 2   amitriptyline (ELAVIL) 25 MG tablet Take 1 tablet  (25 mg total) by mouth at bedtime. 90 tablet 1   flintstones complete (FLINTSTONES) 60 MG chewable tablet Chew 1 tablet by mouth 2 (two) times daily.     Vitamin D, Ergocalciferol, (DRISDOL) 1.25 MG (50000 UNIT) CAPS capsule Take 1 capsule (50,000 Units total) by mouth every 7 (seven) days. 12 capsule 2   Facility-Administered Medications Prior to Visit  Medication Dose Route Frequency Provider Last Rate Last Admin   [place] IUD   Intrauterine Once Guss Bunde, MD        No Known Allergies  Review of Systems  Psychiatric/Behavioral:  Positive for depression.       Objective:    Physical Exam Constitutional:      Appearance: Normal appearance.  Neurological:     Mental Status: She is alert and oriented to person, place, and time.    There were no vitals taken for this visit. Wt Readings from Last 3 Encounters:  10/27/20 172 lb 3.2 oz (78.1 kg)  07/21/20 170 lb 12.8 oz (77.5 kg)  03/21/20 155 lb (70.3 kg)    Diabetic Foot Exam - Simple   No data filed    Lab Results  Component Value Date   WBC 6.3 07/21/2020   HGB 11.4 (L) 07/21/2020   HCT 35.0 (L) 07/21/2020   PLT 291.0 07/21/2020   GLUCOSE 75 07/21/2020   CHOL 240 (H) 07/21/2020   TRIG 123.0 07/21/2020   HDL 44.70 07/21/2020   LDLCALC 170 (H) 07/21/2020   ALT 14 07/21/2020   AST 16 07/21/2020   NA 136 07/21/2020   K 3.7 07/21/2020   CL 102 07/21/2020   CREATININE 0.84 07/21/2020   BUN 13 07/21/2020   CO2 28 07/21/2020   TSH 1.70 07/21/2020   INR 0.93 06/24/2011   HGBA1C 5.9 03/22/2019    Lab Results  Component Value Date   TSH 1.70 07/21/2020   Lab Results  Component Value Date   WBC 6.3 07/21/2020   HGB 11.4 (L) 07/21/2020   HCT 35.0 (L) 07/21/2020   MCV 78.2 07/21/2020   PLT 291.0 07/21/2020   Lab Results  Component Value Date   NA 136 07/21/2020   K 3.7 07/21/2020   CO2 28 07/21/2020   GLUCOSE 75 07/21/2020   BUN 13 07/21/2020   CREATININE 0.84 07/21/2020    BILITOT 0.6 07/21/2020   ALKPHOS 63 07/21/2020   AST 16 07/21/2020   ALT 14 07/21/2020   PROT 8.3 07/21/2020   ALBUMIN 4.7 07/21/2020   CALCIUM 9.7  07/21/2020   GFR 85.67 07/21/2020   Lab Results  Component Value Date   CHOL 240 (H) 07/21/2020   Lab Results  Component Value Date   HDL 44.70 07/21/2020   Lab Results  Component Value Date   LDLCALC 170 (H) 07/21/2020   Lab Results  Component Value Date   TRIG 123.0 07/21/2020   Lab Results  Component Value Date   CHOLHDL 5 07/21/2020   Lab Results  Component Value Date   HGBA1C 5.9 03/22/2019       Assessment & Plan:   Problem List Items Addressed This Visit       Unprioritized   Anxiety   Relevant Medications   escitalopram (LEXAPRO) 10 MG tablet   Depression with anxiety - Primary    Hold amitriptyline  Start lexapro 10 mg 1 po qd  F/u 3-4 weeks  Make app with psych      Relevant Medications   escitalopram (LEXAPRO) 10 MG tablet     Meds ordered this encounter  Medications   escitalopram (LEXAPRO) 10 MG tablet    Sig: Take 1 tablet (10 mg total) by mouth daily.    Dispense:  30 tablet    Refill:  2    I discussed the assessment and treatment plan with the patient. The patient was provided an opportunity to ask questions and all were answered. The patient agreed with the plan and demonstrated an understanding of the instructions.   The patient was advised to call back or seek an in-person evaluation if the symptoms worsen or if the condition fails to improve as anticipated.  I provided 20 minutes of face-to-face time during this encounter.   I,Zite Okoli,acting as a Education administrator for [employer], DO.,have documented all relevant documentation on the behalf of Ann Held, DO,as directed by  Ann Held, DO while in the presence of [place], DO, have reviewed all documentation for this visit. The documentation on 03/27/21 for the  exam, diagnosis, procedures, and orders are all accurate and complete.    Ann Held, DO [place] [facility] [employer] [facility] [phone] (phone) [phone] (fax)  [place]

## 2021-03-27 NOTE — Assessment & Plan Note (Signed)
Hold amitriptyline  Start lexapro 10 mg 1 po qd  F/u 3-4 weeks  Make app with psych

## 2021-04-01 ENCOUNTER — Other Ambulatory Visit: Payer: Self-pay

## 2021-04-01 DIAGNOSIS — F419 Anxiety disorder, unspecified: Secondary | ICD-10-CM

## 2021-04-01 MED ORDER — ESCITALOPRAM OXALATE 10 MG PO TABS: 10.0000 mg | ORAL_TABLET | Freq: Every day | ORAL | 2 refills | Status: DC

## 2021-04-07 ENCOUNTER — Telehealth: Payer: Self-pay | Admitting: Family Medicine

## 2021-04-07 NOTE — Telephone Encounter (Signed)
Patient states her escitalopram (LEXAPRO) 10 MG tablet  medicine is making her sick. Ever since she has been taking it it has made her nauseous, get stomach aches and overall sick feeling. She would also like the list of physiatrist she was supposed to get sent to her mychart. Please advice.

## 2021-04-08 NOTE — Telephone Encounter (Signed)
Pt. Called in to get advice regarding how long does it take for medication to get out of system. Spoke with Nira Conn and received advice that it can take about a week based on the length of time pt was taking medication. Pt understands.

## 2021-04-13 ENCOUNTER — Ambulatory Visit: Payer: BC Managed Care – PPO | Admitting: Family Medicine

## 2021-04-15 ENCOUNTER — Telehealth: Payer: Self-pay | Admitting: Family Medicine

## 2021-04-15 NOTE — Telephone Encounter (Signed)
Received. I have placed forms in folder. Pt may need OV? Please advise

## 2021-04-15 NOTE — Telephone Encounter (Signed)
Document faxed to our office to be filled out by provider ([place] term disability paperwork- 5 pages) Document put at front office tray under providers name.

## 2021-04-17 ENCOUNTER — Other Ambulatory Visit: Payer: Self-pay

## 2021-04-17 ENCOUNTER — Encounter: Payer: Self-pay | Admitting: Family Medicine

## 2021-04-17 ENCOUNTER — Ambulatory Visit (INDEPENDENT_AMBULATORY_CARE_PROVIDER_SITE_OTHER): Payer: BC Managed Care – PPO | Admitting: Family Medicine

## 2021-04-17 VITALS — BP 104/78 | HR 79 | Temp 98.5°F | Resp 18 | Ht 64.0 in | Wt 180.0 lb

## 2021-04-17 DIAGNOSIS — G4700 Insomnia, unspecified: Secondary | ICD-10-CM | POA: Diagnosis not present

## 2021-04-17 DIAGNOSIS — F418 Other specified anxiety disorders: Secondary | ICD-10-CM | POA: Diagnosis not present

## 2021-04-17 MED ORDER — AMITRIPTYLINE HCL 25 MG PO TABS
25.0000 mg | ORAL_TABLET | Freq: Every day | ORAL | 1 refills | Status: DC
Start: 2021-04-17 — End: 2021-07-28

## 2021-04-17 NOTE — Patient Instructions (Signed)

## 2021-04-17 NOTE — Assessment & Plan Note (Signed)
Pt came with paperwork to fill out but it was functional capacity paperwork and pt is on fmla for mental health She will call HR and get back to [place] with what to do next Pt given list of psych to make appointment  She has been on mult meds and they have not worked

## 2021-04-17 NOTE — Progress Notes (Signed)
Subjective:   By signing my name below, I, Zite Okoli, attest that this documentation has been prepared under the direction and in the presence of  Roma Schanz R DO. 04/17/2021      Patient ID: Dawn Gray, female    DOB: December 07, 1975, 45 y.o.   MRN: [id number]  Chief Complaint  Patient presents with   Depression   Anxiety   Follow-up    HPI Patient is in today for an office visit.  She was prescribed 10 mg lexapro at her last visit but had to stop because she was having adverse reactions to it. She was told to stop taking them. She would like to start 25 mg elavil again to help her with sleeping. She is interested in seeing a psychiatrist because several medications have not worked for her.   She mentions she met with a physical therapist and is due to start physical therapy soon after he sees her hormone levels.  She had faxed over disability paperwork from her job to fill but it was the wrong one.  She is not interested in the flu vaccine at this visit.  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   Migraine    Panic attack     Past Surgical History:  Procedure Laterality Date   TUMOR REMOVAL  01/2009   From Lung and Heart    Family History  Problem Relation Age of Onset   Hypertension Mother    Diabetes Mother    Arthritis Mother    Diabetes Father    Hypertension Father    Hyperlipidemia Father    Graves' disease Father    Anxiety disorder Father    Arthritis Paternal Grandmother    Hyperlipidemia Paternal Grandmother    Stroke Paternal Grandmother    Lung cancer Paternal Grandmother        brain mets   Diabetes Paternal Grandmother    Hyperlipidemia Maternal Grandfather    Cancer Maternal Grandfather        unsure  what kind   Hyperlipidemia Paternal Grandfather    Heart disease Maternal Uncle    Stroke Maternal Grandmother    Colon cancer Neg Hx     Social History   Socioeconomic History   Marital status: Divorced    Spouse name: Not on file   Number of children: 4   Years of education: Not on file   Highest education level: Some college, no degree  Occupational History   Occupation: customer service  Tobacco Use   Smoking status: Never   Smokeless tobacco: Never  Vaping Use   Vaping Use: Never used  Substance and Sexual Activity   Alcohol use: Yes    Comment: occasional   Drug use: No   Sexual activity: Yes    Partners: Male    Birth control/protection: None  Other Topics Concern   Not on file  Social History Narrative   Not on file   Social Determinants of Health   Financial Resource Strain: Not on file  Food Insecurity: Not on file  Transportation Needs: Not on file  Physical Activity: Not on file  Stress: Not on file  Social Connections:  Not on file  Intimate Partner Violence: Not on file    Outpatient Medications Prior to Visit  Medication Sig Dispense Refill   ALPRAZolam (XANAX) 0.25 MG tablet Take 1 tablet (0.25 mg total) by mouth 2 (two) times daily as needed for anxiety. 30 tablet 2   flintstones complete (FLINTSTONES) 60 MG chewable tablet Chew 1 tablet by mouth 2 (two) times daily.     Vitamin D, Ergocalciferol, (DRISDOL) 1.25 MG (50000 UNIT) CAPS capsule Take 1 capsule (50,000 Units total) by mouth every 7 (seven) days. 12 capsule 2   amitriptyline (ELAVIL) 25 MG tablet Take 1 tablet (25 mg total) by mouth at bedtime. 90 tablet 1   escitalopram (LEXAPRO) 10 MG tablet Take 1 tablet (10 mg total) by mouth daily. (Patient not taking: Reported on 04/17/2021) 30 tablet 2   Facility-Administered Medications Prior to Visit  Medication Dose Route Frequency Provider Last Rate Last Admin   [place] IUD   Intrauterine Once Guss Bunde, MD         No Known Allergies  Review of Systems  Constitutional:  Negative for fever.  HENT:  Negative for congestion, ear pain, hearing loss, sinus pain and sore throat.   Eyes:  Negative for blurred vision and pain.  Respiratory:  Negative for cough, sputum production, shortness of breath and wheezing.   Cardiovascular:  Negative for chest pain and palpitations.  Gastrointestinal:  Negative for blood in stool, constipation, diarrhea, nausea and vomiting.  Genitourinary:  Negative for dysuria, frequency, hematuria and urgency.  Musculoskeletal:  Negative for back pain, falls and myalgias.  Neurological:  Negative for dizziness, sensory change, loss of consciousness, weakness and headaches.  Endo/Heme/Allergies:  Negative for environmental allergies. Does not bruise/bleed easily.  Psychiatric/Behavioral:  Positive for depression. Negative for suicidal ideas. The patient is not nervous/anxious and does not have insomnia.       Objective:    Physical Exam Constitutional:      General: She is not in acute distress.    Appearance: Normal appearance. She is not ill-appearing.  HENT:     Head: Normocephalic and atraumatic.     Right Ear: External ear normal.     Left Ear: External ear normal.  Eyes:     Extraocular Movements: Extraocular movements intact.     Pupils: Pupils are equal, round, and reactive to light.  Cardiovascular:     Rate and Rhythm: Normal rate and regular rhythm.     Pulses: Normal pulses.     Heart sounds: Normal heart sounds. No murmur heard.   No gallop.  Pulmonary:     Effort: Pulmonary effort is normal. No respiratory distress.     Breath sounds: Normal breath sounds. No wheezing, rhonchi or rales.  Abdominal:     General: Bowel sounds are normal. There is no distension.     Palpations: Abdomen is soft. There is no mass.     Tenderness: There is no abdominal tenderness. There is no guarding or rebound.     Hernia: No hernia is present.  Musculoskeletal:      Cervical back: Normal range of motion and neck supple.  Lymphadenopathy:     Cervical: No cervical adenopathy.  Skin:    General: Skin is warm and dry.  Neurological:     Mental Status: She is alert and oriented to person, place, and time.  Psychiatric:        Behavior: Behavior normal.    BP 104/78 (BP Location: Right Arm, Patient  Position: Sitting, Cuff Size: Normal)   Pulse 79   Temp 98.5 F (36.9 C) (Oral)   Resp 18   Ht '5\' 4"'  (1.626 m)   Wt 180 lb (81.6 kg)   SpO2 99%   BMI 30.90 kg/m  Wt Readings from Last 3 Encounters:  04/17/21 180 lb (81.6 kg)  10/27/20 172 lb 3.2 oz (78.1 kg)  07/21/20 170 lb 12.8 oz (77.5 kg)    Diabetic Foot Exam - Simple   No data filed    Lab Results  Component Value Date   WBC 6.3 07/21/2020   HGB 11.4 (L) 07/21/2020   HCT 35.0 (L) 07/21/2020   PLT 291.0 07/21/2020   GLUCOSE 75 07/21/2020   CHOL 240 (H) 07/21/2020   TRIG 123.0 07/21/2020   HDL 44.70 07/21/2020   LDLCALC 170 (H) 07/21/2020   ALT 14 07/21/2020   AST 16 07/21/2020   NA 136 07/21/2020   K 3.7 07/21/2020   CL 102 07/21/2020   CREATININE 0.84 07/21/2020   BUN 13 07/21/2020   CO2 28 07/21/2020   TSH 1.70 07/21/2020   INR 0.93 06/24/2011   HGBA1C 5.9 03/22/2019    Lab Results  Component Value Date   TSH 1.70 07/21/2020   Lab Results  Component Value Date   WBC 6.3 07/21/2020   HGB 11.4 (L) 07/21/2020   HCT 35.0 (L) 07/21/2020   MCV 78.2 07/21/2020   PLT 291.0 07/21/2020   Lab Results  Component Value Date   NA 136 07/21/2020   K 3.7 07/21/2020   CO2 28 07/21/2020   GLUCOSE 75 07/21/2020   BUN 13 07/21/2020   CREATININE 0.84 07/21/2020   BILITOT 0.6 07/21/2020   ALKPHOS 63 07/21/2020   AST 16 07/21/2020   ALT 14 07/21/2020   PROT 8.3 07/21/2020   ALBUMIN 4.7 07/21/2020   CALCIUM 9.7 07/21/2020   GFR 85.67 07/21/2020   Lab Results  Component Value Date   CHOL 240 (H) 07/21/2020   Lab Results  Component Value Date   HDL 44.70 07/21/2020    Lab Results  Component Value Date   LDLCALC 170 (H) 07/21/2020   Lab Results  Component Value Date   TRIG 123.0 07/21/2020   Lab Results  Component Value Date   CHOLHDL 5 07/21/2020   Lab Results  Component Value Date   HGBA1C 5.9 03/22/2019       Assessment & Plan:   Problem List Items Addressed This Visit       Unprioritized   Depression with anxiety - Primary    Pt came with paperwork to fill out but it was functional capacity paperwork and pt is on fmla for mental health She will call HR and get back to [place] with what to do next Pt given list of psych to make appointment  She has been on mult meds and they have not worked       Relevant Medications   amitriptyline (ELAVIL) 25 MG tablet   Other Relevant Orders   CBC with Differential/Platelet   Comprehensive metabolic panel   Hemoglobin A1c   Hormone Panel   TSH   Vitamin B12   VITAMIN D 25 Hydroxy (Vit-D Deficiency, Fractures)   Other Visit Diagnoses     Insomnia, unspecified type       Relevant Medications   amitriptyline (ELAVIL) 25 MG tablet   Other Relevant Orders   CBC with Differential/Platelet   Comprehensive metabolic panel   Hemoglobin A1c   Hormone Panel  TSH   Vitamin B12   VITAMIN D 25 Hydroxy (Vit-D Deficiency, Fractures)       Meds ordered this encounter  Medications   amitriptyline (ELAVIL) 25 MG tablet    Sig: Take 1 tablet (25 mg total) by mouth at bedtime.    Dispense:  90 tablet    Refill:  1    I,Zite Okoli,acting as a Education administrator for [employer], DO.,have documented all relevant documentation on the behalf of Ann Held, DO,as directed by  Ann Held, DO while in the presence of Ann Held, DO.   I, Ann Held DO., personally preformed the services described in this documentation.  All medical record entries made by the scribe were at my direction and in my presence.  I have reviewed the chart and discharge instructions (if  applicable) and agree that the record reflects my personal performance and is accurate and complete. 04/17/2021

## 2021-04-18 LAB — COMPREHENSIVE METABOLIC PANEL
AG Ratio: 1.3 (calc) (ref 1.0–2.5)
ALT: 14 U/L (ref 6–29)
AST: 15 U/L (ref 10–30)
Albumin: 4.6 g/dL (ref 3.6–5.1)
Alkaline phosphatase (APISO): 58 U/L (ref 31–125)
BUN: 12 mg/dL (ref 7–25)
CO2: 23 mmol/L (ref 20–32)
Calcium: 9.9 mg/dL (ref 8.6–10.2)
Chloride: 103 mmol/L (ref 98–110)
Creat: 0.92 mg/dL (ref 0.50–0.99)
Globulin: 3.6 g/dL (calc) (ref 1.9–3.7)
Glucose, Bld: 87 mg/dL (ref 65–99)
Potassium: 4.4 mmol/L (ref 3.5–5.3)
Sodium: 136 mmol/L (ref 135–146)
Total Bilirubin: 0.5 mg/dL (ref 0.2–1.2)
Total Protein: 8.2 g/dL — ABNORMAL HIGH (ref 6.1–8.1)

## 2021-04-18 LAB — CBC WITH DIFFERENTIAL/PLATELET
Absolute Monocytes: 520 cells/uL (ref 200–950)
Basophils Absolute: 64 cells/uL (ref 0–200)
Basophils Relative: 0.8 %
Eosinophils Absolute: 200 cells/uL (ref 15–500)
Eosinophils Relative: 2.5 %
HCT: 38.4 % (ref 35.0–45.0)
Hemoglobin: 12 g/dL (ref 11.7–15.5)
Lymphs Abs: 2616 cells/uL (ref 850–3900)
MCH: 24.7 pg — ABNORMAL LOW (ref 27.0–33.0)
MCHC: 31.3 g/dL — ABNORMAL LOW (ref 32.0–36.0)
MCV: 79.2 fL — ABNORMAL LOW (ref 80.0–100.0)
MPV: 11.6 fL (ref 7.5–12.5)
Monocytes Relative: 6.5 %
Neutro Abs: 4600 cells/uL (ref 1500–7800)
Neutrophils Relative %: 57.5 %
Platelets: 287 10*3/uL (ref 140–400)
RBC: 4.85 10*6/uL (ref 3.80–5.10)
RDW: 14.5 % (ref 11.0–15.0)
Total Lymphocyte: 32.7 %
WBC: 8 10*3/uL (ref 3.8–10.8)

## 2021-04-18 LAB — HEMOGLOBIN A1C
Hgb A1c MFr Bld: 5.6 % of total Hgb (ref <5.7)
Mean Plasma Glucose: 114 mg/dL
eAG (mmol/L): 6.3 mmol/L

## 2021-04-18 LAB — VITAMIN D 25 HYDROXY (VIT D DEFICIENCY, FRACTURES): Vit D, 25-Hydroxy: 25 ng/mL — ABNORMAL LOW (ref 30–100)

## 2021-04-18 LAB — VITAMIN B12: Vitamin B-12: 756 pg/mL (ref 200–1100)

## 2021-04-18 LAB — TSH: TSH: 2.44 mIU/L

## 2021-04-22 ENCOUNTER — Other Ambulatory Visit: Payer: Self-pay | Admitting: Family Medicine

## 2021-04-22 DIAGNOSIS — D649 Anemia, unspecified: Secondary | ICD-10-CM

## 2021-04-22 DIAGNOSIS — E559 Vitamin D deficiency, unspecified: Secondary | ICD-10-CM

## 2021-04-22 NOTE — Telephone Encounter (Signed)
11 pages faxed in from [employer]( short term dis)  Placed in lownes in up front

## 2021-04-24 LAB — STATUS REPORT

## 2021-04-25 LAB — HORMONE PANEL (T4,TSH,FSH,TESTT,SHBG,DHEA,ETC)
Free T-3: 3.1 pg/mL
T4: 7.4 ug/dL
TSH: 2.6 uU/mL
Triiodothyronine (T-3), Serum: 101 ng/dL

## 2021-04-28 ENCOUNTER — Encounter: Payer: Self-pay | Admitting: Family Medicine

## 2021-04-28 ENCOUNTER — Ambulatory Visit: Payer: BC Managed Care – PPO | Admitting: Family Medicine

## 2021-04-28 NOTE — Telephone Encounter (Signed)
Received. This has been placed on hold due to the type of forms that the insurance is requestiing.

## 2021-04-29 ENCOUNTER — Other Ambulatory Visit: Payer: Self-pay

## 2021-04-29 DIAGNOSIS — G4700 Insomnia, unspecified: Secondary | ICD-10-CM

## 2021-04-29 DIAGNOSIS — F418 Other specified anxiety disorders: Secondary | ICD-10-CM

## 2021-07-06 DIAGNOSIS — F339 Major depressive disorder, recurrent, unspecified: Secondary | ICD-10-CM | POA: Diagnosis not present

## 2021-07-06 DIAGNOSIS — F411 Generalized anxiety disorder: Secondary | ICD-10-CM | POA: Diagnosis not present

## 2021-07-14 ENCOUNTER — Telehealth: Payer: Self-pay | Admitting: Family Medicine

## 2021-07-14 DIAGNOSIS — K625 Hemorrhage of anus and rectum: Secondary | ICD-10-CM | POA: Diagnosis not present

## 2021-07-14 DIAGNOSIS — Z3202 Encounter for pregnancy test, result negative: Secondary | ICD-10-CM | POA: Diagnosis not present

## 2021-07-14 DIAGNOSIS — N760 Acute vaginitis: Secondary | ICD-10-CM | POA: Diagnosis not present

## 2021-07-14 DIAGNOSIS — B9689 Other specified bacterial agents as the cause of diseases classified elsewhere: Secondary | ICD-10-CM | POA: Diagnosis not present

## 2021-07-14 DIAGNOSIS — R1031 Right lower quadrant pain: Secondary | ICD-10-CM | POA: Diagnosis not present

## 2021-07-14 NOTE — Telephone Encounter (Signed)
Noted. Pt will need a OV if triage doesn't call us.

## 2021-07-14 NOTE — Telephone Encounter (Signed)
Noted. Pt going to the ED.

## 2021-07-14 NOTE — Telephone Encounter (Signed)
Nurse Assessment Nurse: Rolena Infante, RN, Patrice Date/Time (Eastern Time): 07/14/2021 9:01:30 AM Confirm and document reason for call. If symptomatic, describe symptoms. ---Caller states she has bright red blood in her stools. For 3 days now. Alarmed due to amount of blood. Tired and lightheadedness. No pain today. Had previous days had some abdominal pain. Does the patient have any new or worsening symptoms? ---Yes Will a triage be completed? ---Yes Related visit to physician within the last 2 weeks? ---No Does the PT have any chronic conditions? (i.e. diabetes, asthma, this includes High risk factors for pregnancy, etc.) ---Yes List chronic conditions. ---Anxiety, Depression Is the patient pregnant or possibly pregnant? (Ask all females between the ages of 38-55) ---No Is this a behavioral health or substance abuse call? ---No Guidelines Guideline Title Affirmed Question Affirmed Notes Nurse Date/Time (Eastern Time) Rectal Bleeding SEVERE rectal bleeding (large blood clots; constant or on and off bleeding) Rolena Infante, RN, Patrice 07/14/2021 9:04:02 AM PLEASE NOTE: All timestamps contained within this report are represented as [place] Standard Time. CONFIDENTIALTY NOTICE: This fax transmission is intended only for the addressee. It contains information that is legally privileged, confidential or otherwise protected from use or disclosure. If you are not the intended recipient, you are strictly prohibited from reviewing, disclosing, copying using or disseminating any of this information or taking any action in reliance on or regarding this information. If you have received this fax in error, please notify us immediately by telephone so that we can arrange for its return to [place]. Phone: [phone], Toll-Free: [phone], Fax: [phone] Page: 2 of 2 Call Id: [id number] Dawn Gray. Time Eilene Ghazi Time) Disposition Final User 07/14/2021 9:07:48 AM Go to ED Now Yes Rolena Infante, RN, Patrice Caller  Disagree/Comply Comply Caller Understands Yes PreDisposition Call Doctor Care Advice Given Per Guideline GO TO ED NOW: * You need to be seen in the Emergency Department. NOTE TO TRIAGER - DRIVING: BRING MEDICINES: Referrals GO TO FACILITY OTHER - SPECIFY

## 2021-07-14 NOTE — Telephone Encounter (Signed)
Dawn Gray stated that she is has been having bloody stools. Transferred to triage.

## 2021-07-15 ENCOUNTER — Encounter: Payer: Self-pay | Admitting: Family Medicine

## 2021-07-15 ENCOUNTER — Ambulatory Visit (INDEPENDENT_AMBULATORY_CARE_PROVIDER_SITE_OTHER): Payer: BC Managed Care – PPO | Admitting: Family Medicine

## 2021-07-15 VITALS — BP 121/57 | HR 75 | Temp 98.0°F | Ht 64.0 in | Wt 178.8 lb

## 2021-07-15 DIAGNOSIS — K625 Hemorrhage of anus and rectum: Secondary | ICD-10-CM

## 2021-07-15 NOTE — Progress Notes (Signed)
Acute Office Visit  Subjective:    Patient ID: Tamorah Hada, female    DOB: 14-Jul-1975, 46 y.o.   MRN: [id number]  Chief Complaint  Patient presents with   Rectal Bleeding    Rectal Bleeding   Patient is in today for rectal bleeding.  07/14/21 ED for Abdominal pain and rectal bleeding: CT scan inconclusive other than uterine fibroids which explains her RLQ discomfort. Blood work stable. Treated for vaginitis.    Today is now day 4 of rectal bleeding. Patient states she is now having "a lot of blood" (as much as she would see if she were on her period) coming from her rectum with every bowel movement or passing gas. States the ED did see an external hemorrhoid but there was not signs of it bleeding. Reports she has felt 1 week of fatigue, lightheadedness, weakness. Denies history of colitis, diverticulitis, Crohns. States she had a normal colonoscopy 3 years ago (cannot remember why she was screened early).        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   Migraine    Panic attack     Past Surgical History:  Procedure Laterality Date   TUMOR REMOVAL  01/2009   From Lung and Heart    Family History  Problem Relation Age of Onset   Hypertension Mother    Diabetes Mother    Arthritis Mother    Diabetes Father    Hypertension Father    Hyperlipidemia Father    Graves' disease Father    Anxiety disorder Father    Arthritis Paternal Grandmother    Hyperlipidemia Paternal Grandmother    Stroke Paternal Grandmother    Lung cancer Paternal Grandmother        brain mets   Diabetes Paternal Grandmother    Hyperlipidemia Maternal Grandfather    Cancer Maternal Grandfather        unsure what kind   Hyperlipidemia Paternal Grandfather     Heart disease Maternal Uncle    Stroke Maternal Grandmother    Colon cancer Neg Hx     Social History   Socioeconomic History   Marital status: Divorced    Spouse name: Not on file   Number of children: 4   Years of education: Not on file   Highest education level: Some college, no degree  Occupational History   Occupation: customer service  Tobacco Use   Smoking status: Never   Smokeless tobacco: Never  Vaping Use   Vaping Use: Never used  Substance and Sexual Activity   Alcohol use: Yes    Comment: occasional   Drug use: No   Sexual activity: Yes    Partners: Male    Birth control/protection: None  Other Topics Concern   Not on file  Social History Narrative   Not on file   Social Determinants of Health   Financial Resource Strain: Not on file  Food Insecurity: Not on file  Transportation Needs: Not on file  Physical Activity: Not on file  Stress: Not on file  Social Connections: Not on file  Intimate Partner Violence: Not on file    Outpatient Medications Prior to Visit  Medication Sig  Dispense Refill   ALPRAZolam (XANAX) 0.25 MG tablet Take 1 tablet (0.25 mg total) by mouth 2 (two) times daily as needed for anxiety. 30 tablet 2   amitriptyline (ELAVIL) 25 MG tablet Take 1 tablet (25 mg total) by mouth at bedtime. 90 tablet 1   flintstones complete (FLINTSTONES) 60 MG chewable tablet Chew 1 tablet by mouth 2 (two) times daily.     Vitamin D, Ergocalciferol, (DRISDOL) 1.25 MG (50000 UNIT) CAPS capsule Take 1 capsule (50,000 Units total) by mouth every 7 (seven) days. 12 capsule 2   Facility-Administered Medications Prior to Visit  Medication Dose Route Frequency Provider Last Rate Last Admin   [place] IUD   Intrauterine Once Guss Bunde, MD        No Known Allergies  Review of Systems  Gastrointestinal:  Positive for hematochezia.  All review of systems negative except what is listed in the HPI     Objective:    Physical  Exam Vitals reviewed.  Constitutional:      Appearance: Normal appearance.  Cardiovascular:     Rate and Rhythm: Normal rate and regular rhythm.  Pulmonary:     Effort: Pulmonary effort is normal.     Breath sounds: Normal breath sounds.  Abdominal:     Tenderness: There is abdominal tenderness in the right lower quadrant. There is no guarding or rebound.  Neurological:     General: No focal deficit present.     Mental Status: She is alert and oriented to person, place, and time. Mental status is at baseline.  Psychiatric:        Mood and Affect: Mood normal.        Behavior: Behavior normal.        Thought Content: Thought content normal.        Judgment: Judgment normal.    BP (!) 121/57    Pulse 75    Temp 98 F (36.7 C)    Ht 5\' 4"  (1.626 m)    Wt 178 lb 12.8 oz (81.1 kg)    SpO2 100%    BMI 30.69 kg/m  Wt Readings from Last 3 Encounters:  07/15/21 178 lb 12.8 oz (81.1 kg)  04/17/21 180 lb (81.6 kg)  10/27/20 172 lb 3.2 oz (78.1 kg)    Health Maintenance Due  Topic Date Due   COVID-19 Vaccine (3 - Pfizer risk series) 03/11/2020    There are no preventive care reminders to display for this patient.   Lab Results  Component Value Date   TSH 2.44 04/17/2021   Lab Results  Component Value Date   WBC 8.0 04/17/2021   HGB 12.0 04/17/2021   HCT 38.4 04/17/2021   MCV 79.2 (L) 04/17/2021   PLT 287 04/17/2021   Lab Results  Component Value Date   NA 136 04/17/2021   K 4.4 04/17/2021   CO2 23 04/17/2021   GLUCOSE 87 04/17/2021   BUN 12 04/17/2021   CREATININE 0.92 04/17/2021   BILITOT 0.5 04/17/2021   ALKPHOS 63 07/21/2020   AST 15 04/17/2021   ALT 14 04/17/2021   PROT 8.2 (H) 04/17/2021   ALBUMIN 4.7 07/21/2020   CALCIUM 9.9 04/17/2021   GFR 85.67 07/21/2020   Lab Results  Component Value Date   CHOL 240 (H) 07/21/2020   Lab Results  Component Value Date   HDL 44.70 07/21/2020   Lab Results  Component Value Date   LDLCALC 170 (H) 07/21/2020    Lab Results  Component Value Date   TRIG 123.0 07/21/2020   Lab Results  Component Value Date   CHOLHDL 5 07/21/2020   Lab Results  Component Value Date   HGBA1C 5.6 04/17/2021       Assessment & Plan:   1. Rectal bleeding Urgent GI referral placed - if you don't hear something by tomorrow, call our office.  Go back to the ED if you become more fatigued, lightheaded, bleeding will not stop, etc.    - Ambulatory referral to Gastroenterology   Follow-up after seeing specialist or as needed.   Terrilyn Saver, NP

## 2021-07-15 NOTE — Patient Instructions (Signed)
Urgent GI referral placed - if you don't hear something by tomorrow, call our office.  Go back to the ED if you become more fatigued, lightheaded, bleeding will not stop, etc.

## 2021-07-23 ENCOUNTER — Telehealth: Payer: Self-pay | Admitting: Family Medicine

## 2021-07-23 NOTE — Telephone Encounter (Signed)
Forms faxed into front office Placed in lowne bin up front

## 2021-07-24 ENCOUNTER — Other Ambulatory Visit: Payer: Self-pay

## 2021-07-24 ENCOUNTER — Encounter: Payer: Self-pay | Admitting: Gastroenterology

## 2021-07-24 ENCOUNTER — Ambulatory Visit (INDEPENDENT_AMBULATORY_CARE_PROVIDER_SITE_OTHER): Payer: BC Managed Care – PPO | Admitting: Gastroenterology

## 2021-07-24 VITALS — BP 122/68 | HR 103 | Ht 64.0 in | Wt 182.5 lb

## 2021-07-24 DIAGNOSIS — K648 Other hemorrhoids: Secondary | ICD-10-CM

## 2021-07-24 DIAGNOSIS — K921 Melena: Secondary | ICD-10-CM

## 2021-07-24 DIAGNOSIS — D259 Leiomyoma of uterus, unspecified: Secondary | ICD-10-CM

## 2021-07-24 DIAGNOSIS — R1030 Lower abdominal pain, unspecified: Secondary | ICD-10-CM

## 2021-07-24 DIAGNOSIS — D509 Iron deficiency anemia, unspecified: Secondary | ICD-10-CM

## 2021-07-24 MED ORDER — HYDROCORTISONE ACETATE 25 MG RE SUPP: 25.0000 mg | Freq: Two times a day (BID) | RECTAL | 1 refills | Status: DC

## 2021-07-24 NOTE — Patient Instructions (Signed)
If you are age 44 or older, your body mass index should be between 23-30. Your Body mass index is 31.33 kg/m. If this is out of the aforementioned range listed, please consider follow up with your Primary Care Provider.  If you are age 25 or younger, your body mass index should be between 19-25. Your Body mass index is 31.33 kg/m. If this is out of the aformentioned range listed, please consider follow up with your Primary Care Provider.   __________________________________________________________  The [facility] providers would like to encourage you to use [facility] to communicate with providers for non-urgent requests or questions.  Due to long hold times on the telephone, sending your provider a message by [facility] may be a faster and more efficient way to get a response.  Please allow 48 business hours for a response.  Please remember that this is for non-urgent requests.   We have given you a sample for Clenpiq for your colonoscopy.  Thank you for choosing me and [facility].  Vito Cirigliano, D.O.

## 2021-07-24 NOTE — Progress Notes (Signed)
Chief Complaint: Hematochezia   Referring Provider:     Carollee Herter, Alferd Apa, DO    HPI:     Dawn Gray is a 46 y.o. female referred to the Gastroenterology Clinic for evaluation of hematochezia.  Symptoms started at the end of January described as described as acute onset BRBPR filling toilet water and tissue paper.  Does have associated lower abdominal pain. Some nausea, no emesis. +LH, dizziness. No preceding constipation, diarrhea. No straining, prolonged time on toilet.  Was otherwise in her usual state of health.  No prior similar sxs.  Was seen in the ER on 07/14/2021 for evaluation of BRBPR x3 days.  HD stable and other than external hemorrhoid, exam otherwise unremarkable. - H/H 11.6/35.4 with MCV/RDW 77/15.5.  All stable from previous dating back to 2014 - Normal WBC, PLT, CMP UA - Pelvic exam c/w BV. - CTAP: Bladder compressed by overlying large right eccentric uterine fibroid.  Borderline prominence and enhancement of appendix but less likely appendicitis. - Discharged with Anusol for hemorrhoids with GI and GYN referral  Was seen in follow-up by primary care on 07/15/2021.    Has not tried the Anusol or any other medications for the sxs. Some overall improvement in the last 2-3 days. No dyschezia or rectal pain.   Trying to get appt with GYN.  No known family history of CRC, GI malignancy, liver disease, pancreatic disease, or IBD.    Endoscopic History: - Colonoscopy (07/2012):Normal colon (path benign).  Normal TI.  Internal hemorrhoids.   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   Migraine    Panic attack      Past Surgical History:  Procedure Laterality Date   TUMOR REMOVAL  01/2009   From Lung and Heart    Family History  Problem Relation Age of Onset   Hypertension Mother    Diabetes Mother    Arthritis Mother    Diabetes Father    Hypertension Father    Hyperlipidemia Father    Graves' disease Father    Anxiety disorder Father    Prostate cancer Father    Stroke Maternal Grandmother    Lung cancer Maternal Grandmother    Hyperlipidemia Maternal Grandfather    Cancer Maternal Grandfather        unsure what kind   Arthritis Paternal Grandmother    Hyperlipidemia Paternal Grandmother    Stroke Paternal Grandmother    Lung cancer Paternal Grandmother        brain mets   Diabetes Paternal Grandmother    Brain cancer Paternal Grandmother    Hyperlipidemia Paternal Grandfather    Heart disease Maternal Uncle    Colon cancer Neg Hx    Rectal cancer Neg Hx    Stomach cancer Neg Hx    Social History   Tobacco Use   Smoking status: Never   Smokeless tobacco: Never  Vaping Use   Vaping Use: Never used  Substance Use Topics   Alcohol use: Yes    Comment: occasional   Drug use: No   Current Outpatient Medications  Medication Sig Dispense Refill  ALPRAZolam (XANAX) 0.25 MG tablet Take 1 tablet (0.25 mg total) by mouth 2 (two) times daily as needed for anxiety. 30 tablet 2   amitriptyline (ELAVIL) 25 MG tablet Take 1 tablet (25 mg total) by mouth at bedtime. 90 tablet 1   flintstones complete (FLINTSTONES) 60 MG chewable tablet Chew 1 tablet by mouth 2 (two) times daily.     Vitamin D, Ergocalciferol, (DRISDOL) 1.25 MG (50000 UNIT) CAPS capsule Take 1 capsule (50,000 Units total) by mouth every 7 (seven) days. 12 capsule 2   Current Facility-Administered Medications  Medication Dose Route Frequency Provider Last Rate Last Admin   PARAGARD INTRAUTERINE [place] IUD   Intrauterine Once Guss Bunde, MD       No Known Allergies   Review of Systems: All systems reviewed and negative except where noted in HPI.     Physical Exam:    Wt Readings from Last 3 Encounters:   07/24/21 182 lb 8 oz (82.8 kg)  07/15/21 178 lb 12.8 oz (81.1 kg)  04/17/21 180 lb (81.6 kg)    BP 122/68    Pulse (!) 103    Ht 5\' 4"  (1.626 m)    Wt 182 lb 8 oz (82.8 kg)    SpO2 98%    BMI 31.33 kg/m  Constitutional:  Pleasant, in no acute distress. Psychiatric: Normal mood and affect. Behavior is normal. EENT: Pupils normal.  Conjunctivae are normal. No scleral icterus. Neck supple. No cervical LAD. Cardiovascular: Normal rate, regular rhythm. No edema Pulmonary/chest: Effort normal and breath sounds normal. No wheezing, rales or rhonchi. Abdominal: Mild TTP in bilateral lower abdomen.  No rebound or guarding.  No peritoneal signs.  Soft, nondistended. Bowel sounds active throughout. There are no masses palpable. No hepatomegaly. Neurological: Alert and oriented to person place and time. Skin: Skin is warm and dry. No rashes noted. Rectal: Exam deferred to time of colonoscopy.    ASSESSMENT AND PLAN;   1) Hematochezia 2) Internal hemorrhoids Discussed full DDx for presenting symptoms at length today to include internal hemorrhoids and other benign anorectal pathology, along with IBD, ulcers, polyps, mass, etc. Based on age, time since last colonoscopy, persistent symptoms, and elevated patient concerns, plan to proceed as follows:  - Expedited Colonoscopy - Anusol suppository - If clinically significant hemorrhoids and no other etiology for bleeding, plan for hemorrhoid band ligation  3) Microcytic anemia - Has had microcytic anemia dating back to at least 2014.  H/H largely stable.  Suspect thalassemia trait.  We discussed iron studies, B12, thalassemia testing, etc., and after discussion with patient, she elected to hold off on iron studies, etc. today.  Can reevaluate at follow-up  4) Lower abdominal pain 5) Uterine fibroid - While the uterine fibroid would certainly be a good explanation for her lower abdominal pain, the acute onset in relationship to onset of hematochezia  is certainly suspicious for intra mucosal/intraluminal pathology.  Plan to evaluate with expedited colonoscopy as above - GYN referral in place  The indications, risks, and benefits of colonoscopy were explained to the patient in detail. Risks include but are not limited to bleeding, perforation, adverse reaction to medications, and cardiopulmonary compromise. Sequelae include but are not limited to the possibility of surgery, hospitalization, and mortality. The patient verbalized understanding and wished to proceed. All questions answered, referred to the scheduler and bowel prep ordered. Further recommendations pending results of the exam.    Lavena Bullion, DO, FACG  07/24/2021, 4:05 PM   Carollee Herter,  Alferd Apa, *

## 2021-07-27 ENCOUNTER — Encounter: Payer: Self-pay | Admitting: Gastroenterology

## 2021-07-27 NOTE — Telephone Encounter (Signed)
Received

## 2021-07-27 NOTE — Telephone Encounter (Signed)
2/14 @ 2 pm

## 2021-07-27 NOTE — Telephone Encounter (Signed)
Can we see if patient could set up a virtual w/ [facility] just so fill out these forms correctly?

## 2021-07-28 ENCOUNTER — Encounter: Payer: Self-pay | Admitting: Family Medicine

## 2021-07-28 ENCOUNTER — Telehealth (INDEPENDENT_AMBULATORY_CARE_PROVIDER_SITE_OTHER): Payer: Self-pay | Admitting: Family Medicine

## 2021-07-28 DIAGNOSIS — G4700 Insomnia, unspecified: Secondary | ICD-10-CM

## 2021-07-28 DIAGNOSIS — Z0279 Encounter for issue of other medical certificate: Secondary | ICD-10-CM

## 2021-07-28 DIAGNOSIS — D219 Benign neoplasm of connective and other soft tissue, unspecified: Secondary | ICD-10-CM

## 2021-07-28 DIAGNOSIS — F418 Other specified anxiety disorders: Secondary | ICD-10-CM

## 2021-07-28 MED ORDER — AMITRIPTYLINE HCL 25 MG PO TABS: 25.0000 mg | ORAL_TABLET | Freq: Every day | ORAL | 1 refills | Status: AC

## 2021-07-28 NOTE — Progress Notes (Addendum)
MyChart Video Visit    Virtual Visit via Video Note   This visit type was conducted due to national recommendations for restrictions regarding the COVID-19 Pandemic (e.g. social distancing) in an effort to limit this patient's exposure and mitigate transmission in our community. This patient is at least at moderate risk for complications without adequate follow up. This format is felt to be most appropriate for this patient at this time. Physical exam was limited by quality of the video and audio technology used for the visit. Alinda Dooms was able to get the patient set up on a video visit.  Patient location: Home Patient and provider in visit Provider location: Office  I discussed the limitations of evaluation and management by telemedicine and the availability of in person appointments. The patient expressed understanding and agreed to proceed.  Visit Date: 07/28/2021  Today's healthcare provider: Ann Held, DO     Subjective:    Patient ID: Dawn Gray, female    DOB: 04/06/1976, 46 y.o.   MRN: [id number]  Chief Complaint  Patient presents with   FMLA    HPI Patient is in today for a video visit.  Patient is requesting FMLA paper work for her job. She is requesting 4-6 weeks off from her work starting yesterday. She cannot regularly interact with customers due to losing focus while speaking. She is currently seeing a psychiatrist and counselor at this time. She sees her counselor every other week. She was diagnosed with PTSD. She recently started taking 20 mg Prozac to manage her symptoms.  She is requesting a refill for 25 mg amitriptyline daily PO.  She reports visiting a emergency room for vaginal bleeding and was found to have bleeding fibroids. She was recommend she see her GYN specialist to manage her care. She has not made an appointment at this time and is requesting for an appointment to be made. She continues having pain at this time.    Past  Medical History:  Diagnosis Date   Anemia    Bronchitis    Hx of Chronic   Cat allergies    Depression    Environmental allergies    seasonal   GERD (gastroesophageal reflux disease)    Herpes    + serum, no outbreaks   Lung tumor 01/12/2009   benign tumors in the lung and heart   Migraine    Panic attack     Past Surgical History:  Procedure Laterality Date   TUMOR REMOVAL  01/2009   From Lung and Heart    Family History  Problem Relation Age of Onset   Hypertension Mother    Diabetes Mother    Arthritis Mother    Diabetes Father    Hypertension Father    Hyperlipidemia Father    Graves' disease Father    Anxiety disorder Father    Prostate cancer Father    Stroke Maternal Grandmother    Lung cancer Maternal Grandmother    Hyperlipidemia Maternal Grandfather    Cancer Maternal Grandfather        unsure what kind   Arthritis Paternal Grandmother    Hyperlipidemia Paternal Grandmother    Stroke Paternal Grandmother    Lung cancer Paternal Grandmother        brain mets   Diabetes Paternal Grandmother    Brain cancer Paternal Grandmother    Hyperlipidemia Paternal Grandfather    Heart disease Maternal Uncle    Colon cancer Neg Hx    Rectal cancer  Neg Hx    Stomach cancer Neg Hx     Social History   Socioeconomic History   Marital status: Divorced    Spouse name: Not on file   Number of children: 4   Years of education: Not on file   Highest education level: Some college, no degree  Occupational History   Occupation: customer service  Tobacco Use   Smoking status: Never   Smokeless tobacco: Never  Vaping Use   Vaping Use: Never used  Substance and Sexual Activity   Alcohol use: Yes    Comment: occasional   Drug use: No   Sexual activity: Yes    Partners: Male    Birth control/protection: I.U.D.  Other Topics Concern   Not on file  Social History Narrative   Not on file   Social Determinants of Health   Financial Resource Strain: Not on  file  Food Insecurity: Not on file  Transportation Needs: Not on file  Physical Activity: Not on file  Stress: Not on file  Social Connections: Not on file  Intimate Partner Violence: Not on file    Outpatient Medications Prior to Visit  Medication Sig Dispense Refill   ALPRAZolam (XANAX) 0.25 MG tablet Take 1 tablet (0.25 mg total) by mouth 2 (two) times daily as needed for anxiety. 30 tablet 2   flintstones complete (FLINTSTONES) 60 MG chewable tablet Chew 1 tablet by mouth 2 (two) times daily.     FLUoxetine (PROZAC) 20 MG capsule Take 1 capsule (20 mg total) by mouth daily.  3   hydrocortisone (ANUSOL-HC) 25 MG suppository Place 1 suppository (25 mg total) rectally every 12 (twelve) hours. 12 suppository 1   Vitamin D, Ergocalciferol, (DRISDOL) 1.25 MG (50000 UNIT) CAPS capsule Take 1 capsule (50,000 Units total) by mouth every 7 (seven) days. 12 capsule 2   amitriptyline (ELAVIL) 25 MG tablet Take 1 tablet (25 mg total) by mouth at bedtime. 90 tablet 1   Facility-Administered Medications Prior to Visit  Medication Dose Route Frequency Provider Last Rate Last Admin   PARAGARD INTRAUTERINE COPPER IUD   Intrauterine Once Guss Bunde, MD        No Known Allergies  Review of Systems  Constitutional:  Negative for fever and malaise/fatigue.  HENT:  Negative for congestion.   Eyes:  Negative for blurred vision.  Respiratory:  Negative for cough and shortness of breath.   Cardiovascular:  Negative for chest pain, palpitations and leg swelling.  Gastrointestinal:  Negative for vomiting.  Musculoskeletal:  Negative for back pain.  Skin:  Negative for rash.  Neurological:  Negative for loss of consciousness and headaches.  Psychiatric/Behavioral:  Positive for depression. Negative for hallucinations, memory loss, substance abuse and suicidal ideas. The patient is not nervous/anxious and does not have insomnia.       Objective:    Physical Exam Vitals and nursing note reviewed.   Psychiatric:        Mood and Affect: Mood is depressed. Mood is not anxious. Affect is blunt and flat. Affect is not labile or tearful.        Speech: Speech normal.        Behavior: Behavior normal.        Thought Content: Thought content normal.        Cognition and Memory: Cognition normal.        Judgment: Judgment normal.   There were no vitals taken for this visit. Wt Readings from Last 3 Encounters:  08/14/21 182  lb (82.6 kg)  08/05/21 182 lb (82.6 kg)  07/24/21 182 lb 8 oz (82.8 kg)    Diabetic Foot Exam - Simple   No data filed    Lab Results  Component Value Date   WBC 8.0 04/17/2021   HGB 12.0 04/17/2021   HCT 38.4 04/17/2021   PLT 287 04/17/2021   GLUCOSE 87 04/17/2021   CHOL 240 (H) 07/21/2020   TRIG 123.0 07/21/2020   HDL 44.70 07/21/2020   LDLCALC 170 (H) 07/21/2020   ALT 14 04/17/2021   AST 15 04/17/2021   NA 136 04/17/2021   K 4.4 04/17/2021   CL 103 04/17/2021   CREATININE 0.92 04/17/2021   BUN 12 04/17/2021   CO2 23 04/17/2021   TSH 2.44 04/17/2021   INR 0.93 06/24/2011   HGBA1C 5.6 04/17/2021    Lab Results  Component Value Date   TSH 2.44 04/17/2021   Lab Results  Component Value Date   WBC 8.0 04/17/2021   HGB 12.0 04/17/2021   HCT 38.4 04/17/2021   MCV 79.2 (L) 04/17/2021   PLT 287 04/17/2021   Lab Results  Component Value Date   NA 136 04/17/2021   K 4.4 04/17/2021   CO2 23 04/17/2021   GLUCOSE 87 04/17/2021   BUN 12 04/17/2021   CREATININE 0.92 04/17/2021   BILITOT 0.5 04/17/2021   ALKPHOS 63 07/21/2020   AST 15 04/17/2021   ALT 14 04/17/2021   PROT 8.2 (H) 04/17/2021   ALBUMIN 4.7 07/21/2020   CALCIUM 9.9 04/17/2021   GFR 85.67 07/21/2020   Lab Results  Component Value Date   CHOL 240 (H) 07/21/2020   Lab Results  Component Value Date   HDL 44.70 07/21/2020   Lab Results  Component Value Date   LDLCALC 170 (H) 07/21/2020   Lab Results  Component Value Date   TRIG 123.0 07/21/2020   Lab Results   Component Value Date   CHOLHDL 5 07/21/2020   Lab Results  Component Value Date   HGBA1C 5.6 04/17/2021       Assessment & Plan:   Problem List Items Addressed This Visit       Unprioritized   Depression with anxiety - Primary   Relevant Medications   amitriptyline (ELAVIL) 25 MG tablet   FLUoxetine (PROZAC) 20 MG capsule   Other Visit Diagnoses     Insomnia, unspecified type       Relevant Medications   amitriptyline (ELAVIL) 25 MG tablet   Fibroid       Relevant Orders   Ambulatory referral to Obstetrics / Gynecology        Meds ordered this encounter  Medications   amitriptyline (ELAVIL) 25 MG tablet    Sig: Take 1 tablet (25 mg total) by mouth at bedtime.    Dispense:  90 tablet    Refill:  1    I discussed the assessment and treatment plan with the patient. The patient was provided an opportunity to ask questions and all were answered. The patient agreed with the plan and demonstrated an understanding of the instructions.   The patient was advised to call back or seek an in-person evaluation if the symptoms worsen or if the condition fails to improve as anticipated.  I,Shehryar Baig,acting as a Education administrator for [employer], DO.,have documented all relevant documentation on the behalf of Ann Held, DO,as directed by  Ann Held, DO while in the presence of Ann Held, DO.  I provided 20 minutes of face-to-face time during this encounter.   Ann Held, DO [place] [facility] [employer] [phone] (phone) [phone] (fax)  [place]

## 2021-08-05 ENCOUNTER — Ambulatory Visit (AMBULATORY_SURGERY_CENTER): Payer: BC Managed Care – PPO | Admitting: Gastroenterology

## 2021-08-05 ENCOUNTER — Other Ambulatory Visit: Payer: Self-pay

## 2021-08-05 ENCOUNTER — Telehealth: Payer: Self-pay

## 2021-08-05 ENCOUNTER — Encounter: Payer: Self-pay | Admitting: Gastroenterology

## 2021-08-05 VITALS — BP 108/66 | HR 75 | Temp 97.3°F | Resp 12 | Ht 64.0 in | Wt 182.0 lb

## 2021-08-05 DIAGNOSIS — K644 Residual hemorrhoidal skin tags: Secondary | ICD-10-CM

## 2021-08-05 DIAGNOSIS — K920 Hematemesis: Secondary | ICD-10-CM | POA: Diagnosis not present

## 2021-08-05 DIAGNOSIS — K921 Melena: Secondary | ICD-10-CM

## 2021-08-05 DIAGNOSIS — K641 Second degree hemorrhoids: Secondary | ICD-10-CM | POA: Diagnosis not present

## 2021-08-05 DIAGNOSIS — R1030 Lower abdominal pain, unspecified: Secondary | ICD-10-CM | POA: Diagnosis not present

## 2021-08-05 MED ORDER — SODIUM CHLORIDE 0.9 % IV SOLN: 500.0000 mL | Freq: Once | INTRAVENOUS | Status: DC

## 2021-08-05 NOTE — Progress Notes (Signed)
Report to PACU, RN, vss, BBS= Clear.  

## 2021-08-05 NOTE — Patient Instructions (Signed)
Discharge instructions given. Handouts on Hemorrhoids and information on banding. Resume previous medications. YOU HAD AN ENDOSCOPIC PROCEDURE TODAY AT [facility]:   Refer to the procedure report that was given to you for any specific questions about what was found during the examination.  If the procedure report does not answer your questions, please call your gastroenterologist to clarify.  If you requested that your care partner not be given the details of your procedure findings, then the procedure report has been included in a sealed envelope for you to review at your convenience later.  YOU SHOULD EXPECT: Some feelings of bloating in the abdomen. Passage of more gas than usual.  Walking can help get rid of the air that was put into your GI tract during the procedure and reduce the bloating. If you had a lower endoscopy (such as a colonoscopy or flexible sigmoidoscopy) you may notice spotting of blood in your stool or on the toilet paper. If you underwent a bowel prep for your procedure, you may not have a normal bowel movement for a few days.  Please Note:  You might notice some irritation and congestion in your nose or some drainage.  This is from the oxygen used during your procedure.  There is no need for concern and it should clear up in a day or so.  SYMPTOMS TO REPORT IMMEDIATELY:  Following lower endoscopy (colonoscopy or flexible sigmoidoscopy):  Excessive amounts of blood in the stool  Significant tenderness or worsening of abdominal pains  Swelling of the abdomen that is new, acute  Fever of 100F or higher   For urgent or emergent issues, a gastroenterologist can be reached at any hour by calling [phone]. Do not use MyChart messaging for urgent concerns.    DIET:  We do recommend a small meal at first, but then you may proceed to your regular diet.  Drink plenty of fluids but you should avoid alcoholic beverages for 24 hours.  ACTIVITY:  You should  plan to take it easy for the rest of today and you should NOT DRIVE or use heavy machinery until tomorrow (because of the sedation medicines used during the test).    FOLLOW UP: Our staff will call the number listed on your records 48-72 hours following your procedure to check on you and address any questions or concerns that you may have regarding the information given to you following your procedure. If we do not reach you, we will leave a message.  We will attempt to reach you two times.  During this call, we will ask if you have developed any symptoms of COVID 19. If you develop any symptoms (ie: fever, flu-like symptoms, shortness of breath, cough etc.) before then, please call [phone].  If you test positive for Covid 19 in the 2 weeks post procedure, please call and report this information to [place].    If any biopsies were taken you will be contacted by phone or by letter within the next 1-3 weeks.  Please call us at [phone] if you have not heard about the biopsies in 3 weeks.    SIGNATURES/CONFIDENTIALITY: You and/or your care partner have signed paperwork which will be entered into your electronic medical record.  These signatures attest to the fact that that the information above on your After Visit Summary has been reviewed and is understood.  Full responsibility of the confidentiality of this discharge information lies with you and/or your care-partner.

## 2021-08-05 NOTE — Progress Notes (Signed)
GASTROENTEROLOGY PROCEDURE H&P NOTE   Primary Care Physician: Ann Held, DO    Reason for Procedure:  Hematochezia  Plan:    Colonoscopy  Patient is appropriate for endoscopic procedure(s) in the ambulatory ([facility]) setting.  The nature of the procedure, as well as the risks, benefits, and alternatives were carefully and thoroughly reviewed with the patient. Ample time for discussion and questions allowed. The patient understood, was satisfied, and agreed to proceed.     HPI: Dawn Gray is a 46 y.o. female who presents for colonoscopy for evaluation of hematochezia, lower abdominal pain.  Patient was most recently seen in the Gastroenterology Clinic on 07/24/2021 by me.  No interval change in medical history since that appointment. Please refer to that note for full details regarding GI history and clinical presentation.   Endoscopic History: - Colonoscopy (07/2012):Normal colon (path benign).  Normal TI.  Internal hemorrhoids.  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 Authorizing Provider  amitriptyline (ELAVIL) 25 MG tablet Take 1 tablet (25 mg total) by mouth at bedtime. 07/28/21  Yes Roma Schanz R, DO  FLUoxetine (PROZAC) 20 MG capsule Take 1 capsule (20 mg total) by mouth daily. 07/28/21  Yes Ann Held, DO  ALPRAZolam (XANAX) 0.25 MG tablet Take 1 tablet (0.25 mg total) by mouth 2 (two) times daily as needed for anxiety. 10/27/20   Ann Held, DO  flintstones complete  (FLINTSTONES) 60 MG chewable tablet Chew 1 tablet by mouth 2 (two) times daily.    [provider]  hydrocortisone (ANUSOL-HC) 25 MG suppository Place 1 suppository (25 mg total) rectally every 12 (twelve) hours. Patient not taking: Reported on 08/05/2021 07/24/21   Arneshia Ade, Dominic Pea, DO  Vitamin D, Ergocalciferol, (DRISDOL) 1.25 MG (50000 UNIT) CAPS capsule Take 1 capsule (50,000 Units total) by mouth every 7 (seven) days. Patient not taking: Reported on 08/05/2021 07/23/20   Ann Held, DO    Current Outpatient Medications  Medication Sig Dispense Refill   amitriptyline (ELAVIL) 25 MG tablet Take 1 tablet (25 mg total) by mouth at bedtime. 90 tablet 1   FLUoxetine (PROZAC) 20 MG capsule Take 1 capsule (20 mg total) by mouth daily.  3   ALPRAZolam (XANAX) 0.25 MG tablet Take 1 tablet (0.25 mg total) by mouth 2 (two) times daily as needed for anxiety. 30 tablet 2   flintstones complete (FLINTSTONES) 60 MG chewable tablet Chew 1 tablet by mouth 2 (two) times daily.     hydrocortisone (ANUSOL-HC) 25 MG suppository Place 1 suppository (25 mg total) rectally every 12 (twelve) hours. (Patient not taking: Reported on 08/05/2021) 12 suppository 1   Vitamin D, Ergocalciferol, (DRISDOL) 1.25 MG (50000 UNIT) CAPS capsule Take 1 capsule (50,000 Units total) by mouth every 7 (seven) days. (Patient not taking: Reported on 08/05/2021) 12 capsule 2   Current Facility-Administered  Medications  Medication Dose Route Frequency Provider Last Rate Last Admin   0.9 %  sodium chloride infusion  500 mL Intravenous Once Tadeo Besecker V, DO       [place] IUD   Intrauterine Once Guss Bunde, MD        Allergies as of 08/05/2021   (No Known Allergies)    Family History  Problem Relation Age of Onset   Hypertension Mother    Diabetes Mother    Arthritis Mother    Diabetes Father    Hypertension Father    Hyperlipidemia Father    Graves' disease Father    Anxiety  disorder Father    Prostate cancer Father    Stroke Maternal Grandmother    Lung cancer Maternal Grandmother    Hyperlipidemia Maternal Grandfather    Cancer Maternal Grandfather        unsure what kind   Arthritis Paternal Grandmother    Hyperlipidemia Paternal Grandmother    Stroke Paternal Grandmother    Lung cancer Paternal Grandmother        brain mets   Diabetes Paternal Grandmother    Brain cancer Paternal Grandmother    Hyperlipidemia Paternal Grandfather    Heart disease Maternal Uncle    Colon cancer Neg Hx    Rectal cancer Neg Hx    Stomach cancer Neg Hx     Social History   Socioeconomic History   Marital status: Divorced    Spouse name: Not on file   Number of children: 4   Years of education: Not on file   Highest education level: Some college, no degree  Occupational History   Occupation: customer service  Tobacco Use   Smoking status: Never   Smokeless tobacco: Never  Vaping Use   Vaping Use: Never used  Substance and Sexual Activity   Alcohol use: Yes    Comment: occasional   Drug use: No   Sexual activity: Yes    Partners: Male    Birth control/protection: I.U.D.  Other Topics Concern   Not on file  Social History Narrative   Not on file   Social Determinants of Health   Financial Resource Strain: Not on file  Food Insecurity: Not on file  Transportation Needs: Not on file  Physical Activity: Not on file  Stress: Not on file  Social Connections: Not on file  Intimate Partner Violence: Not on file    Physical Exam: Vital signs in last 24 hours: @BP  119/69    Pulse 71    Temp (!) 97.3 F (36.3 C)    Ht 5\' 4"  (1.626 m)    Wt 182 lb (82.6 kg)    SpO2 99%    BMI 31.24 kg/m  GEN: NAD EYE: Sclerae anicteric ENT: MMM CV: Non-tachycardic Pulm: CTA b/l GI: Soft, NT/ND NEURO:  Alert & Oriented x 3   Gerrit Heck, DO [facility]   08/05/2021 7:23 AM

## 2021-08-05 NOTE — Op Note (Signed)
[facility] Patient Name: Dawn Gray Procedure Date: 08/05/2021 7:24 AM MRN: [id number] Endoscopist: Gerrit Heck , MD Age: 46 Referring MD:  Date of Birth: 08/01/1975 Gender: Female Account #: [id number] Procedure:                Colonoscopy Indications:              Lower abdominal pain, Hematochezia Medicines:                Monitored Anesthesia Care Procedure:                Pre-Anesthesia Assessment:                           - Prior to the procedure, a History and Physical                            was performed, and patient medications and                            allergies were reviewed. The patient's tolerance of                            previous anesthesia was also reviewed. The risks                            and benefits of the procedure and the sedation                            options and risks were discussed with the patient.                            All questions were answered, and informed consent                            was obtained. Prior Anticoagulants: The patient has                            taken no previous anticoagulant or antiplatelet                            agents. ASA Grade Assessment: II - A patient with                            mild systemic disease. After reviewing the risks                            and benefits, the patient was deemed in                            satisfactory condition to undergo the procedure.                           After obtaining informed consent, the colonoscope  was passed under direct vision. Throughout the                            procedure, the patient's blood pressure, pulse, and                            oxygen saturations were monitored continuously. The                            CF [id number] #[id number] was introduced through the anus                            and advanced to the the cecum, identified by                            appendiceal orifice and  ileocecal valve. The                            colonoscopy was performed without difficulty. The                            patient tolerated the procedure well. The quality                            of the bowel preparation was good. The ileocecal                            valve, appendiceal orifice, and rectum were                            photographed. Scope In: 7:32:11 AM Scope Out: 3:24:40 AM Scope Withdrawal Time: 0 hours 10 minutes 42 seconds  Total Procedure Duration: 0 hours 15 minutes 40 seconds  Findings:                 Hemorrhoids and external skin tags were found on                            perianal exam.                           The colon appeared normal.                           Non-bleeding internal hemorrhoids were found during                            retroflexion. The hemorrhoids were medium-sized and                            Grade II (internal hemorrhoids that prolapse but                            reduce spontaneously). Complications:            No immediate complications. Estimated Blood Loss:  Estimated blood loss: none. Impression:               - Hemorrhoids found on perianal exam.                           - The entire examined colon is normal.                           - Non-bleeding internal hemorrhoids.                           - No specimens collected. Recommendation:           - Patient has a contact number available for                            emergencies. The signs and symptoms of potential                            delayed complications were discussed with the                            patient. Return to normal activities tomorrow.                            Written discharge instructions were provided to the                            patient.                           - Resume previous diet.                           - Continue present medications.                           - Repeat colonoscopy in 10 years for screening                             purposes.                           - Return to GI clinic PRN.                           - Use fiber, for example Citrucel, Fibercon, Konsyl                            or Metamucil.                           - Internal hemorrhoids were noted on this study and                            may be amenable to hemorrhoid band ligation. If you  are interested in further treatment of these                            hemorrhoids with band ligation, please contact my                            clinic to set up an appointment for evaluation and                            treatment. Gerrit Heck, MD 08/05/2021 7:53:49 AM

## 2021-08-05 NOTE — Progress Notes (Signed)
VS by [facility]

## 2021-08-05 NOTE — Telephone Encounter (Signed)
-----   Message from [place], DO sent at 08/05/2021  8:00 AM EST ----- Please set up for hemorrhoid banding in officve. Thanks!

## 2021-08-05 NOTE — Telephone Encounter (Signed)
Scheduled appointment with the patient on 08/14/21 at 9.40 am for Hemorrhoid banding #1 with Dr. Bryan Lemma.

## 2021-08-07 ENCOUNTER — Telehealth: Payer: Self-pay | Admitting: *Deleted

## 2021-08-07 NOTE — Telephone Encounter (Signed)
°  Follow up Call-  Call back number 08/05/2021  Post procedure Call Back phone  # [phone]  Permission to leave phone message Yes  Some recent data might be hidden     Patient questions:  Do you have a fever, pain , or abdominal swelling? No. Pain Score  0 *  Have you tolerated food without any problems? Yes.    Have you been able to return to your normal activities? Yes.    Do you have any questions about your discharge instructions: Diet   No. Medications  No. Follow up visit  No.  Do you have questions or concerns about your Care? No.  Actions: * If pain score is 4 or above: No action needed, pain <4.

## 2021-08-10 DIAGNOSIS — F411 Generalized anxiety disorder: Secondary | ICD-10-CM | POA: Diagnosis not present

## 2021-08-10 DIAGNOSIS — F339 Major depressive disorder, recurrent, unspecified: Secondary | ICD-10-CM | POA: Diagnosis not present

## 2021-08-11 ENCOUNTER — Telehealth: Payer: Self-pay | Admitting: Family Medicine

## 2021-08-11 NOTE — Telephone Encounter (Signed)
Pt would like fmla uploaded in her mychart or email  [email or web address]. She states her boss is requiring a copy of paperwork. Please advise.

## 2021-08-12 ENCOUNTER — Encounter: Payer: Self-pay | Admitting: Family Medicine

## 2021-08-12 NOTE — Telephone Encounter (Signed)
Noted  

## 2021-08-12 NOTE — Telephone Encounter (Signed)
Pt called. LDVM explaining that FMLA was faxed on the day of her virtual and sent to scan and unfortunately the forms have not been scanned into her chart yet and asked if her boss could contact HR to see if they have a copy  ?

## 2021-08-12 NOTE — Telephone Encounter (Signed)
Spoke with patient. Pt states she just needs a note sent to Mychart explaining that you are keep her out of work unntil 03/28? Please confirm/advise ?

## 2021-08-14 ENCOUNTER — Ambulatory Visit (INDEPENDENT_AMBULATORY_CARE_PROVIDER_SITE_OTHER): Payer: BC Managed Care – PPO | Admitting: Gastroenterology

## 2021-08-14 ENCOUNTER — Other Ambulatory Visit: Payer: Self-pay

## 2021-08-14 ENCOUNTER — Encounter: Payer: Self-pay | Admitting: Gastroenterology

## 2021-08-14 VITALS — BP 128/80 | HR 73 | Ht 64.0 in | Wt 182.0 lb

## 2021-08-14 DIAGNOSIS — K649 Unspecified hemorrhoids: Secondary | ICD-10-CM | POA: Diagnosis not present

## 2021-08-14 DIAGNOSIS — K641 Second degree hemorrhoids: Secondary | ICD-10-CM

## 2021-08-14 NOTE — Progress Notes (Signed)
? ?Chief Complaint:    Symptomatic Internal Hemorrhoids; Hemorrhoid Band Ligation ? ?GI History: 46 year old female with symptomatic hemorrhoids.  Index symptoms of intermittent BRBPR feeling toilet water and on tissue paper.  Can have associated lower abdominal pain with those episodes. ? ?- 07/14/2021: CT A/P: Bladder compressed by overlying large right eccentric uterine fibroid.  Borderline prominence and enhancement of appendix but less likely appendicitis. ?- 08/05/2021: Colonoscopy: Medium sized grade 2 internal hemorrhoids.  Otherwise normal.  Repeat 10 years ? ?HPI:   ? ? ?Patient is a 46 y.o. femalewith a history of symptomatic internal hemorrhoids presenting to the Gastroenterology Clinic for follow-up and ongoing treatment. The patient presents with symptomatic grade 2 hemorrhoids, unresponsive to maximal medical therapy, requesting rubber band ligation of symptomatic hemorrhoidal disease. ? ?No change in medical or surgical history, medications, allergies, social history since last appointment with me. ? ? ?Review of systems:     No chest pain, no SOB, no fevers, no urinary sx  ? ?Past Medical History:  ?Diagnosis Date  ? Anemia   ? Bronchitis   ? Hx of Chronic  ? Cat allergies   ? Depression   ? Environmental allergies   ? seasonal  ? GERD (gastroesophageal reflux disease)   ? Herpes   ? + serum, no outbreaks  ? 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 Migraine   ? Panic attack   ? ? ?Patient's surgical history, family medical history, social history, medications and allergies were all reviewed in Epic  ? ? ?Current Outpatient Medications  ?Medication Sig Dispense Refill  ? ALPRAZolam (XANAX) 0.25 MG tablet Take 1 tablet (0.25 mg total) by mouth 2 (two) times daily as needed for anxiety. 30 tablet 2  ? amitriptyline (ELAVIL) 25 MG tablet Take 1 tablet (25 mg total) by mouth at bedtime. 90 tablet 1  ? flintstones complete (FLINTSTONES) 60 MG chewable tablet Chew 1 tablet by mouth 2  (two) times daily.    ? FLUoxetine (PROZAC) 20 MG capsule Take 1 capsule (20 mg total) by mouth daily.  3  ? hydrocortisone (ANUSOL-HC) 25 MG suppository Place 1 suppository (25 mg total) rectally every 12 (twelve) hours. 12 suppository 1  ? Vitamin D, Ergocalciferol, (DRISDOL) 1.25 MG (50000 UNIT) CAPS capsule Take 1 capsule (50,000 Units total) by mouth every 7 (seven) days. 12 capsule 2  ? ?Current Facility-Administered Medications  ?Medication Dose Route Frequency Provider Last Rate Last Admin  ? PARAGARD INTRAUTERINE COPPER IUD   Intrauterine Once Guss Bunde, MD      ? ? ?Physical Exam:   ? ? ?BP 128/80   Pulse 73   Ht 5\' 4"  (1.626 m)   Wt 182 lb (82.6 kg)   SpO2 97%   BMI 31.24 kg/m?  ? ?GENERAL:  Pleasant female in NAD ?PSYCH: : Cooperative, normal affect ?NEURO: Alert and oriented x 3, no focal neurologic deficits ?Rectal exam: Sensation intact and preserved anal wink.  External skin tags.  Grade 2 hemorrhoids noted in all positions on anoscopy.  No external anal fissures noted. Normal sphincter tone. No palpable mass. No blood on the exam glove. (Chaperone: Renee Rival, CMA). ? ? ?IMPRESSION and PLAN:   ? ?#1.  Symptomatic internal hemorrhoids: ?PROCEDURE NOTE: ?The patient presents with symptomatic grade 2 hemorrhoids, unresponsive to maximal medical therapy, requesting rubber band ligation of symptomatic hemorrhoidal disease.  All risks, benefits and alternative forms of therapy were described and informed consent was obtained. ? ?  In the Left Lateral Decubitus position, anoscopic examination revealed grade 2 hemorrhoids in the all position(s).  ?The anorectum was pre-medicated with RectiCare. ?The decision was made to band the LL internal hemorrhoid, and the [place] O?Regan System was used to perform band ligation without complication.  ?Digital anorectal examination was then performed to assure proper positioning of the band, and to adjust the banded tissue as required. ? The patient was  discharged home without pain or other issues.  Dietary and behavioral recommendations were given and along with follow-up instructions.   ?  ?The following adjunctive treatments were recommended: ? ?-Resume high-fiber diet with fiber supplement (i.e. Citrucel or Benefiber) with goal for soft stools without straining to have a BM. ?-Resume adequate fluid intake. ? ?The patient will return in 4 for  follow-up and possible additional banding as required. ?No complications were encountered and the patient tolerated the procedure well. ? ? ? ?Dominic Pea Kristiana Jacko ,DO, FACG 08/14/2021, 10:14 AM ? ?

## 2021-08-14 NOTE — Patient Instructions (Addendum)
If you are age 44 or older, your body mass index should be between 23-30. Your Body mass index is 31.24 kg/m?Marland Kitchen If this is out of the aforementioned range listed, please consider follow up with your Primary Care Provider. ? ?If you are age 41 or younger, your body mass index should be between 19-25. Your Body mass index is 31.24 kg/m?Marland Kitchen If this is out of the aformentioned range listed, please consider follow up with your Primary Care Provider.  ? ?________________________________________________________ ? ?The [facility] providers would like to encourage you to use [facility] to communicate with providers for non-urgent requests or questions.  Due to long hold times on the telephone, sending your provider a message by [facility] may be a faster and more efficient way to get a response.  Please allow 48 business hours for a response.  Please remember that this is for non-urgent requests.  ?_______________________________________________________ ? ?HEMORRHOID BANDING PROCEDURE  ? ? FOLLOW-UP CARE ? ? ?The procedure you have had should have been relatively painless since the banding of the area involved does not have nerve endings and there is no pain sensation.  The rubber band cuts off the blood supply to the hemorrhoid and the band may fall off as soon as 48 hours after the banding (the band may occasionally be seen in the toilet bowl following a bowel movement). You may notice a temporary feeling of fullness in the rectum which should respond adequately to plain Tylenol? or Motrin?. ? ?Following the banding, avoid strenuous exercise that evening and resume full activity the next day.  A sitz bath (soaking in a warm tub) or bidet is soothing, and can be useful for cleansing the area after bowel movements.   ? ? ?To avoid constipation, take two tablespoons of natural wheat bran, natural oat bran, flax, Benefiber? or any over the counter fiber supplement and increase your water intake to 7-8 glasses daily.   ? ?Unless you  have been prescribed anorectal medication, do not put anything inside your rectum for two weeks: No suppositories, enemas, fingers, etc. ? ?Occasionally, you may have more bleeding than usual after the banding procedure.  This is often from the untreated hemorrhoids rather than the treated one.  Don?t be concerned if there is a tablespoon or so of blood.  If there is more blood than this, lie flat with your bottom higher than your head and apply an ice pack to the area. If the bleeding does not stop within a half an hour or if you feel faint, call our office at [phone] or go to the emergency room. ? ?Problems are not common; however, if there is a substantial amount of bleeding, severe pain, chills, fever or difficulty passing urine (very rare) or other problems, you should call us at [phone] or report to the nearest emergency room. ? ?Do not stay seated continuously for more than 2-3 hours for a day or two after the procedure.  Tighten your buttock muscles 10-15 times every two hours and take 10-15 deep breaths every 1-2 hours.  Do not spend more than a few minutes on the toilet if you cannot empty your bowel; instead re-visit the toilet at a later time. ? ? ?It was a pleasure to see you today! ? ?Gerrit Heck, D.O. ? ?

## 2021-08-26 ENCOUNTER — Ambulatory Visit (INDEPENDENT_AMBULATORY_CARE_PROVIDER_SITE_OTHER): Payer: BC Managed Care – PPO | Admitting: Family Medicine

## 2021-08-26 ENCOUNTER — Other Ambulatory Visit (HOSPITAL_COMMUNITY)
Admission: RE | Admit: 2021-08-26 | Discharge: 2021-08-26 | Disposition: A | Discharge status: Home / Self Care | Payer: BC Managed Care – PPO | Source: Ambulatory Visit | Attending: Family Medicine | Admitting: Family Medicine

## 2021-08-26 ENCOUNTER — Encounter: Payer: Self-pay | Admitting: Family Medicine

## 2021-08-26 VITALS — BP 110/57 | HR 73 | Ht 64.0 in | Wt 184.4 lb

## 2021-08-26 DIAGNOSIS — B9689 Other specified bacterial agents as the cause of diseases classified elsewhere: Secondary | ICD-10-CM

## 2021-08-26 DIAGNOSIS — B3731 Acute candidiasis of vulva and vagina: Secondary | ICD-10-CM | POA: Insufficient documentation

## 2021-08-26 DIAGNOSIS — Z113 Encounter for screening for infections with a predominantly sexual mode of transmission: Secondary | ICD-10-CM | POA: Insufficient documentation

## 2021-08-26 DIAGNOSIS — N760 Acute vaginitis: Secondary | ICD-10-CM | POA: Insufficient documentation

## 2021-08-26 DIAGNOSIS — N898 Other specified noninflammatory disorders of vagina: Secondary | ICD-10-CM | POA: Insufficient documentation

## 2021-08-26 NOTE — Patient Instructions (Signed)
Sending swab off to test for yeast, bacterial vaginosis and STDs. We will update you with results and plan. ?

## 2021-08-26 NOTE — Progress Notes (Signed)
? ?Acute Office Visit ? ?Subjective:  ? ? Patient ID: Dawn Gray, female    DOB: 02-04-1976, 46 y.o.   MRN: [id number] ? ?CC: Vaginal irritation ? ? ?HPI ?Patient is in today for vaginal irritation. ? ?Patient reports she is now on day 3 of symptoms. She started with vaginal itching/tingling that was progressively worsening. Yesterday she states her entire perineum felt swollen/irritated. She washed herself really good with a wash cloth and then started having some skin irritation when she urinated - wondering if she caused some small abrasions with the rag. She tried using diaper cream, but that burned so she removed it and then took an epsom salt/alcohol bath which relieved symptoms until this morning they have started returning. She has not noticed any discharge, bleeding, fever/chills, body aches, abdominal pain, pelvic pain, back pain, urinary symptoms. States her last period was about 3 weeks ago and she has not been sexually active since then; IUD in place, she can feel her strings. Reports 2-3 months since last sexual activity. No changes to soaps/detergents or history of STDs.  ? ? ? ? ?Past Medical History:  ?Diagnosis Date  ? Anemia   ? Bronchitis   ? Hx of Chronic  ? Cat allergies   ? Depression   ? Environmental allergies   ? seasonal  ? GERD (gastroesophageal reflux disease)   ? Herpes   ? + serum, no outbreaks  ? 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 Migraine   ? Panic attack   ? ? ?Past Surgical History:  ?Procedure Laterality Date  ? TUMOR REMOVAL  01/2009  ? From Lung and Heart  ? ? ?Family History  ?Problem Relation Age of Onset  ? Hypertension Mother   ? Diabetes Mother   ? Arthritis Mother   ? Diabetes Father   ? Hypertension Father   ? Hyperlipidemia Father   ? Graves' disease Father   ? Anxiety disorder Father   ? Prostate cancer Father   ? Stroke Maternal Grandmother   ? Lung cancer Maternal Grandmother   ? Hyperlipidemia Maternal Grandfather   ? Cancer Maternal  Grandfather   ?     unsure what kind  ? Arthritis Paternal Grandmother   ? Hyperlipidemia Paternal Grandmother   ? Stroke Paternal Grandmother   ? Lung cancer Paternal Grandmother   ?     brain mets  ? Diabetes Paternal Grandmother   ? Brain cancer Paternal Grandmother   ? Hyperlipidemia Paternal Grandfather   ? Heart disease Maternal Uncle   ? Colon cancer Neg Hx   ? Rectal cancer Neg Hx   ? Stomach cancer Neg Hx   ? ? ?Social History  ? ?Socioeconomic History  ? Marital status: Divorced  ?  Spouse name: Not on file  ? Number of children: 4  ? Years of education: Not on file  ? Highest education level: Some college, no degree  ?Occupational History  ? Occupation: customer service  ?Tobacco Use  ? Smoking status: Never  ? Smokeless tobacco: Never  ?Vaping Use  ? Vaping Use: Never used  ?Substance and Sexual Activity  ? Alcohol use: Yes  ?  Comment: occasional  ? Drug use: No  ? Sexual activity: Yes  ?  Partners: Male  ?  Birth control/protection: I.U.D.  ?Other Topics Concern  ? Not on file  ?Social History Narrative  ? Not on file  ? ?Social Determinants of Health  ? ?Financial  Resource Strain: Not on file  ?Food Insecurity: Not on file  ?Transportation Needs: Not on file  ?Physical Activity: Not on file  ?Stress: Not on file  ?Social Connections: Not on file  ?Intimate Partner Violence: Not on file  ? ? ?Outpatient Medications Prior to Visit  ?Medication Sig Dispense Refill  ? ALPRAZolam (XANAX) 0.25 MG tablet Take 1 tablet (0.25 mg total) by mouth 2 (two) times daily as needed for anxiety. 30 tablet 2  ? amitriptyline (ELAVIL) 25 MG tablet Take 1 tablet (25 mg total) by mouth at bedtime. 90 tablet 1  ? FLUoxetine (PROZAC) 20 MG capsule Take 1 capsule (20 mg total) by mouth daily.  3  ? flintstones complete (FLINTSTONES) 60 MG chewable tablet Chew 1 tablet by mouth 2 (two) times daily.    ? hydrocortisone (ANUSOL-HC) 25 MG suppository Place 1 suppository (25 mg total) rectally every 12 (twelve) hours. 12  suppository 1  ? Vitamin D, Ergocalciferol, (DRISDOL) 1.25 MG (50000 UNIT) CAPS capsule Take 1 capsule (50,000 Units total) by mouth every 7 (seven) days. 12 capsule 2  ? ?Facility-Administered Medications Prior to Visit  ?Medication Dose Route Frequency Provider Last Rate Last Admin  ? PARAGARD INTRAUTERINE COPPER IUD   Intrauterine Once Guss Bunde, MD      ? ? ?No Known Allergies ? ?Review of Systems ?All review of systems negative except what is listed in the HPI ? ?   ?Objective:  ?  ?Physical Exam ?Vitals reviewed.  ?Constitutional:   ?   Appearance: Normal appearance. She is obese.  ?Genitourinary: ?   General: Normal vulva.  ?   Vagina: Vaginal discharge present.  ?   Comments: Speculum exam not completed d/t discomfort, slight edema; there was a moderate amount of thick, greenish-yellow vaginal discharge ?Skin: ?   General: Skin is warm and dry.  ?   Findings: No erythema or rash.  ?Neurological:  ?   General: No focal deficit present.  ?   Mental Status: She is alert and oriented to person, place, and time. Mental status is at baseline.  ?Psychiatric:     ?   Mood and Affect: Mood normal.     ?   Behavior: Behavior normal.     ?   Thought Content: Thought content normal.     ?   Judgment: Judgment normal.  ? ? ? ? ? ? ?BP (!) 110/57   Pulse 73   Ht '5\' 4"'$  (1.626 m)   Wt 184 lb 6.4 oz (83.6 kg)   SpO2 100%   BMI 31.65 kg/m?  ?Wt Readings from Last 3 Encounters:  ?08/26/21 184 lb 6.4 oz (83.6 kg)  ?08/14/21 182 lb (82.6 kg)  ?08/05/21 182 lb (82.6 kg)  ? ? ?Health Maintenance Due  ?Topic Date Due  ? COVID-19 Vaccine (3 - Pfizer risk series) 03/11/2020  ? ? ?There are no preventive care reminders to display for this patient. ? ? ?Lab Results  ?Component Value Date  ? TSH 2.44 04/17/2021  ? ?Lab Results  ?Component Value Date  ? WBC 8.0 04/17/2021  ? HGB 12.0 04/17/2021  ? HCT 38.4 04/17/2021  ? MCV 79.2 (L) 04/17/2021  ? PLT 287 04/17/2021  ? ?Lab Results  ?Component Value Date  ? NA 136 04/17/2021   ? K 4.4 04/17/2021  ? CO2 23 04/17/2021  ? GLUCOSE 87 04/17/2021  ? BUN 12 04/17/2021  ? CREATININE 0.92 04/17/2021  ? BILITOT 0.5 04/17/2021  ? ALKPHOS 63  07/21/2020  ? AST 15 04/17/2021  ? ALT 14 04/17/2021  ? PROT 8.2 (H) 04/17/2021  ? ALBUMIN 4.7 07/21/2020  ? CALCIUM 9.9 04/17/2021  ? GFR 85.67 07/21/2020  ? ?Lab Results  ?Component Value Date  ? CHOL 240 (H) 07/21/2020  ? ?Lab Results  ?Component Value Date  ? HDL 44.70 07/21/2020  ? ?Lab Results  ?Component Value Date  ? LDLCALC 170 (H) 07/21/2020  ? ?Lab Results  ?Component Value Date  ? TRIG 123.0 07/21/2020  ? ?Lab Results  ?Component Value Date  ? CHOLHDL 5 07/21/2020  ? ?Lab Results  ?Component Value Date  ? HGBA1C 5.6 04/17/2021  ? ? ?   ?Assessment & Plan:  ? ?1. Vaginal irritation ?Sending swab for further evaluation. Continue supportive measures, avoid douching. Will update her with results and plan. She deferred pregnancy testing today. Patient aware of signs/symptoms requiring further/urgent evaluation.  ?- Cervicovaginal ancillary only ? ?Please contact office for follow-up if symptoms do not improve or worsen. Seek emergency care if symptoms become severe. ? ? ?Terrilyn Saver, NP ? ?

## 2021-08-27 LAB — CERVICOVAGINAL ANCILLARY ONLY
Bacterial Vaginitis (gardnerella): POSITIVE — AB
Candida Glabrata: NEGATIVE
Candida Vaginitis: POSITIVE — AB
Chlamydia: NEGATIVE
Comment: NEGATIVE
Comment: NEGATIVE
Comment: NEGATIVE
Comment: NEGATIVE
Comment: NEGATIVE
Comment: NORMAL
Neisseria Gonorrhea: NEGATIVE
Trichomonas: NEGATIVE

## 2021-08-27 MED ORDER — FLUCONAZOLE 150 MG PO TABS: 150.0000 mg | ORAL_TABLET | Freq: Once | ORAL | 1 refills | Status: AC

## 2021-08-27 MED ORDER — METRONIDAZOLE 500 MG PO TABS: 500.0000 mg | ORAL_TABLET | Freq: Two times a day (BID) | ORAL | 0 refills | Status: AC

## 2021-08-27 NOTE — Addendum Note (Signed)
Addended by: Caleen Jobs B on: 08/27/2021 12:54 PM ? ? Modules accepted: Orders ? ?

## 2021-09-07 DIAGNOSIS — F411 Generalized anxiety disorder: Secondary | ICD-10-CM | POA: Diagnosis not present

## 2021-09-07 DIAGNOSIS — F339 Major depressive disorder, recurrent, unspecified: Secondary | ICD-10-CM | POA: Diagnosis not present

## 2021-09-10 ENCOUNTER — Ambulatory Visit (INDEPENDENT_AMBULATORY_CARE_PROVIDER_SITE_OTHER): Payer: BC Managed Care – PPO | Admitting: Gastroenterology

## 2021-09-10 ENCOUNTER — Encounter: Payer: Self-pay | Admitting: Gastroenterology

## 2021-09-10 DIAGNOSIS — K641 Second degree hemorrhoids: Secondary | ICD-10-CM

## 2021-09-10 NOTE — Progress Notes (Signed)
? ?Chief Complaint:    Symptomatic Internal Hemorrhoids; Hemorrhoid Band Ligation ? ?GI History: 46 year old female with symptomatic hemorrhoids.  Index symptoms of intermittent BRBPR feeling toilet water and on tissue paper.  Can have associated lower abdominal pain with those episodes. ? ?- 07/14/2021: CT A/P: Bladder compressed by overlying large right eccentric uterine fibroid.  Borderline prominence and enhancement of appendix but less likely appendicitis. ?- 08/05/2021: Colonoscopy: Medium sized grade 2 internal hemorrhoids.  Otherwise normal.  Repeat 10 years ?- 08/14/2021: Banding of LL hemorrhoid ?- 09/10/2021: Presents for hemorrhoid banding #2 ? ?HPI:   ? ? ?Patient is a 46 y.o. femalewith a history of symptomatic internal hemorrhoids presenting to [facility] for follow-up and ongoing treatment. The patient presents with symptomatic grade 2 hemorrhoids, unresponsive to maximal medical therapy, requesting rubber band ligation of symptomatic hemorrhoidal disease. ? ?No issues with first hemorrhoid banding earlier this month.  Decrease volume/frequency of BRBPR since last banding. ? ?No change in medical or surgical history, medications, allergies, social history since last appointment with me. ? ? ?Review of systems:     No chest pain, no SOB, no fevers, no urinary sx  ? ?Past Medical History:  ?Diagnosis Date  ? Anemia   ? Bronchitis   ? Hx of Chronic  ? Cat allergies   ? Depression   ? Environmental allergies   ? seasonal  ? GERD (gastroesophageal reflux disease)   ? Herpes   ? + serum, no outbreaks  ? 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 Migraine   ? Panic attack   ? ? ?Patient's surgical history, family medical history, social history, medications and allergies were all reviewed in Epic  ? ? ?Current Outpatient Medications  ?Medication Sig Dispense Refill  ? ALPRAZolam (XANAX) 0.25 MG tablet Take 1 tablet (0.25 mg total) by mouth 2 (two) times daily as needed for  anxiety. 30 tablet 2  ? amitriptyline (ELAVIL) 25 MG tablet Take 1 tablet (25 mg total) by mouth at bedtime. 90 tablet 1  ? FLUoxetine (PROZAC) 20 MG capsule Take 1 capsule (20 mg total) by mouth daily.  3  ? ?Current Facility-Administered Medications  ?Medication Dose Route Frequency Provider Last Rate Last Admin  ? PARAGARD INTRAUTERINE COPPER IUD   Intrauterine Once Guss Bunde, MD      ? ? ?Physical Exam:   ? ? ?BP 120/68   Pulse 88   Ht '5\' 4"'$  (1.626 m)   Wt 179 lb 4 oz (81.3 kg)   SpO2 98%   BMI 30.77 kg/m?  ? ?GENERAL:  Pleasant female in NAD ?PSYCH: : Cooperative, normal affect ?NEURO: Alert and oriented x 3, no focal neurologic deficits ?Rectal exam: Sensation intact and preserved anal wink.  External skin tags.  Grade 2 hemorrhoids noted in RP/RA positions on exam.  No external anal fissures noted. Normal sphincter tone. No palpable mass. No blood on the exam glove. (Chaperone: Renee Rival, CMA). ? ? ?IMPRESSION and PLAN:   ? ?#1.  Symptomatic internal hemorrhoids: ?PROCEDURE NOTE: ?The patient presents with symptomatic grade 2 hemorrhoids, unresponsive to maximal medical therapy, requesting rubber band ligation of symptomatic hemorrhoidal disease.  All risks, benefits and alternative forms of therapy were described and informed consent was obtained. ? ?In the Left Lateral Decubitus position, anoscopic examination revealed grade 2 hemorrhoids in the RA and RP position(s).  ?The anorectum was pre-medicated with RectiCare. ?The decision was made to band the RA and  RP internal hemorrhoid, and the Cortez O?Regan System was used to perform band ligation without complication.  ?Digital anorectal examination was then performed to assure proper positioning of the band, and to adjust the banded tissue as required. Small banded tissue in RA position, so I decided to additionally band the RP column with a good purchase of tissue in RP position.  ? The patient was discharged home without pain or other issues.   Dietary and behavioral recommendations were given and along with follow-up instructions.   ?  ?The following adjunctive treatments were recommended: ? ?-Resume high-fiber diet with fiber supplement (i.e. Citrucel or Benefiber) with goal for soft stools without straining to have a BM. ?-Resume adequate fluid intake. ? ?The patient will return in 4 for  follow-up and possible additional banding as required (plan would be to start with anoscopy and likely target RA column and if any remnant of LL or RP columns) ?No complications were encountered and the patient tolerated the procedure well. ? ?   ? ? ?Lavena Bullion ,DO, FACG 09/10/2021, 9:58 AM ? ?

## 2021-09-10 NOTE — Patient Instructions (Addendum)
If you are age 44 or older, your body mass index should be between 23-30. Your Body mass index is 30.77 kg/m?Marland Kitchen If this is out of the aforementioned range listed, please consider follow up with your Primary Care Provider. ? ?If you are age 84 or younger, your body mass index should be between 19-25. Your Body mass index is 30.77 kg/m?Marland Kitchen If this is out of the aformentioned range listed, please consider follow up with your Primary Care Provider.  ? ?__________________________________________________________ ? ?The [facility] providers would like to encourage you to use [facility] to communicate with providers for non-urgent requests or questions.  Due to long hold times on the telephone, sending your provider a message by [facility] may be a faster and more efficient way to get a response.  Please allow 48 business hours for a response.  Please remember that this is for non-urgent requests.  ? ?HEMORRHOID BANDING PROCEDURE  ? ? FOLLOW-UP CARE ? ? ?The procedure you have had should have been relatively painless since the banding of the area involved does not have nerve endings and there is no pain sensation.  The rubber band cuts off the blood supply to the hemorrhoid and the band may fall off as soon as 48 hours after the banding (the band may occasionally be seen in the toilet bowl following a bowel movement). You may notice a temporary feeling of fullness in the rectum which should respond adequately to plain Tylenol? or Motrin?. ? ?Following the banding, avoid strenuous exercise that evening and resume full activity the next day.  A sitz bath (soaking in a warm tub) or bidet is soothing, and can be useful for cleansing the area after bowel movements.   ? ? ?To avoid constipation, take two tablespoons of natural wheat bran, natural oat bran, flax, Benefiber? or any over the counter fiber supplement and increase your water intake to 7-8 glasses daily.   ? ?Unless you have been prescribed anorectal medication, do not put  anything inside your rectum for two weeks: No suppositories, enemas, fingers, etc. ? ?Occasionally, you may have more bleeding than usual after the banding procedure.  This is often from the untreated hemorrhoids rather than the treated one.  Don?t be concerned if there is a tablespoon or so of blood.  If there is more blood than this, lie flat with your bottom higher than your head and apply an ice pack to the area. If the bleeding does not stop within a half an hour or if you feel faint, call our office at [phone] or go to the emergency room. ? ?Problems are not common; however, if there is a substantial amount of bleeding, severe pain, chills, fever or difficulty passing urine (very rare) or other problems, you should call us at [phone] or report to the nearest emergency room. ? ?Do not stay seated continuously for more than 2-3 hours for a day or two after the procedure.  Tighten your buttock muscles 10-15 times every two hours and take 10-15 deep breaths every 1-2 hours.  Do not spend more than a few minutes on the toilet if you cannot empty your bowel; instead re-visit the toilet at a later time. ? ?  ?Thank you for choosing me and [facility]. ? ?Gerrit Heck, D.O.  ?

## 2021-09-13 NOTE — Progress Notes (Signed)
? ?GYNECOLOGY OFFICE VISIT NOTE ? ?History:  ? Dawn Gray is a 46 y.o. [phone] here today for follow up regarding her fibroid. Found on CT when she went in for abdominal pain and rectal bleeding.  ? ?She had a CT at [facility] that showed 7x7 cm likely serosal vs intramural fibroid that was exerting some compression on her bladder by imaging.  ? ?urinary sx: SUI, urinary urgency -- mixed  ?done with child bearing ?IUD - copper iud since 7 yr ? ? ?  ?Past Medical History:  ?Diagnosis Date  ? Anemia   ? Bronchitis   ? Hx of Chronic  ? Cat allergies   ? Depression   ? Environmental allergies   ? seasonal  ? GERD (gastroesophageal reflux disease)   ? Herpes   ? + serum, no outbreaks  ? 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 Migraine   ? Panic attack   ? ? ?Past Surgical History:  ?Procedure Laterality Date  ? TUMOR REMOVAL  01/2009  ? From Lung and Heart  ? ? ?The following portions of the patient's history were reviewed and updated as appropriate: allergies, current medications, past family history, past medical history, past social history, past surgical history and problem list.  ? ?Health Maintenance:   ?Normal pap:   ?Diagnosis  ?Date Value Ref Range Status  ?03/22/2019   Final  ? - Negative for intraepithelial lesion or malignancy (NILM)  ?  ? ?No history of mammogram.  ? ?Review of Systems:  ?Pertinent items noted in HPI and remainder of comprehensive ROS otherwise negative. ? ?Physical Exam:  ?BP 119/72   Pulse 80   Ht '5\' 4"'$  (1.626 m)   Wt 180 lb (81.6 kg)   LMP 08/27/2021   BMI 30.90 kg/m?  ?CONSTITUTIONAL: Well-developed, well-nourished female in no acute distress.  ?HEENT:  Normocephalic, atraumatic. External right and left ear normal. No scleral icterus.  ?NECK: Normal range of motion, supple, no masses noted on observation ?SKIN: No rash noted. Not diaphoretic. No erythema. No pallor. ?MUSCULOSKELETAL: Normal range of motion. No edema noted. ?NEUROLOGIC: Alert and oriented to  person, place, and time. Normal muscle tone coordination. No cranial nerve deficit noted. ?PSYCHIATRIC: Normal mood and affect. Normal behavior. Normal judgment and thought content. ? ?CARDIOVASCULAR: Normal heart rate noted ?RESPIRATORY: Effort and breath sounds normal, no problems with respiration noted ?ABDOMEN: No masses noted. No other overt distention noted.   ? ?PELVIC: Normal appearing external genitalia; normal urethral meatus; normal appearing vaginal mucosa and cervix.  No abnormal discharge noted.  Normal uterine size, and 7 cm fibroid palpable anteriorly just above the cervix at the bladder, no uterine or adnexal tenderness. Performed in the presence of a chaperone ? ?Labs and Imaging ?No results found for this or any previous visit (from the past 168 hour(s)). ?No results found.  ?Assessment and Plan:  ? 1. Fibroid ?- CT not the best for imaging of fibroids ?- Uterine fibroids: The patient's fibroids are symptomatic and treatment options of expectant management, medical therapy, and surgical therapy were discussed. ?- Expectant management - The patient's fibroids were discussed and expectant management was offered with strict precautions. Discussed f/u US in one year to ensure not growing rapidly (which is not predictive of risk of malignancy but may impact options as noted below) ?- We discussed the GnRH-agonists and antagonists, including relagolix and elagolix. Reviewed both short term and long term impact of these medications.  ?- We  discussed surgical/procedural options available: RFA (I.e. Sonata), uterine artery embolization ([place]), myomectomy and hysterectomy. We discussed the risks and benefits for each of these specific procedures. For [place], recommended preop MRI and referral to interventional radiology.  For the sonata, reviewed that we would need to sign a special consent form for this procedure and with her fibroid's proximity to the bladder this would not be a good option. We discussed the  types and sizes of fibroids that are candidates for hysteroscopic resection of fibroids as well which hers are not. We discussed the potential impact of hysterectomy I.e. prolapse, injury, etc since major surgery.  ?- Following counseling, the patient would like to  consider her options ?- Pending option, best f/u imaging for next step is TVUS ?- We also discussed PFPT may help bladder symptoms such that she may be able to do expectant management.  ? ?Routine preventative health maintenance measures emphasized. ?Please refer to After Visit Summary for other counseling recommendations.  ? ?No follow-ups on file. ? ?Radene Gunning, MD, FACOG ?Obstetrician Social research officer, government, Faculty Practice ?[facility] [place] ? ? ? ? ? ?

## 2021-09-15 ENCOUNTER — Telehealth: Payer: Self-pay | Admitting: Family Medicine

## 2021-09-15 NOTE — Telephone Encounter (Signed)
STD forms faxed in front office ?Placed in bin up front  ? ?

## 2021-09-17 ENCOUNTER — Ambulatory Visit (INDEPENDENT_AMBULATORY_CARE_PROVIDER_SITE_OTHER): Payer: BC Managed Care – PPO | Admitting: Obstetrics and Gynecology

## 2021-09-17 ENCOUNTER — Encounter: Payer: Self-pay | Admitting: Obstetrics and Gynecology

## 2021-09-17 VITALS — BP 119/72 | HR 80 | Ht 64.0 in | Wt 180.0 lb

## 2021-09-17 DIAGNOSIS — D219 Benign neoplasm of connective and other soft tissue, unspecified: Secondary | ICD-10-CM | POA: Diagnosis not present

## 2021-09-17 DIAGNOSIS — N3946 Mixed incontinence: Secondary | ICD-10-CM | POA: Diagnosis not present

## 2021-09-17 NOTE — Telephone Encounter (Signed)
Received

## 2021-09-22 ENCOUNTER — Ambulatory Visit (INDEPENDENT_AMBULATORY_CARE_PROVIDER_SITE_OTHER): Payer: BC Managed Care – PPO

## 2021-09-22 DIAGNOSIS — D251 Intramural leiomyoma of uterus: Secondary | ICD-10-CM | POA: Diagnosis not present

## 2021-09-22 DIAGNOSIS — D219 Benign neoplasm of connective and other soft tissue, unspecified: Secondary | ICD-10-CM

## 2021-09-22 NOTE — Telephone Encounter (Signed)
Spoke with patient and she stated that forms are still about her depression.  She is still out of work.  She has been seeing a therapist.  The therapist that she was seeing just up and left the practice without sending forms to disability place letting them know she is still out of work.  She has an appointment schedule with new therapist either on 4/27 or 4/28.  I have put forms in your folder for review and filled out as much as possible.  Please let me know pt needs appt, which pt knows she may need.  ?

## 2021-09-29 NOTE — Telephone Encounter (Signed)
Form faxed

## 2021-10-01 ENCOUNTER — Ambulatory Visit: Payer: BC Managed Care – PPO | Admitting: Gastroenterology

## 2021-10-08 DIAGNOSIS — F411 Generalized anxiety disorder: Secondary | ICD-10-CM | POA: Diagnosis not present

## 2021-10-08 DIAGNOSIS — F339 Major depressive disorder, recurrent, unspecified: Secondary | ICD-10-CM | POA: Diagnosis not present

## 2021-10-30 ENCOUNTER — Telehealth: Payer: Self-pay | Admitting: Family Medicine

## 2021-10-30 NOTE — Telephone Encounter (Signed)
Dr Earna Coder- [phone]  Calling on behalf of Long term disability papers. Has further questions.

## 2021-11-03 NOTE — Telephone Encounter (Signed)
2nd call regarding following issue:  Dr Earna Coder- [phone]   Calling on behalf of Long term disability papers. Has further questions.

## 2021-11-04 NOTE — Telephone Encounter (Signed)
Called patient to confirm provider. Pt attempted to call the number while we were on the phone to confirm the provider but the call was lost. Pt returned call and states she when she called the number no one picked up and the VM didn't sound "professional".

## 2021-11-06 DIAGNOSIS — F331 Major depressive disorder, recurrent, moderate: Secondary | ICD-10-CM | POA: Diagnosis not present

## 2021-11-06 DIAGNOSIS — F411 Generalized anxiety disorder: Secondary | ICD-10-CM | POA: Diagnosis not present

## 2021-11-10 ENCOUNTER — Other Ambulatory Visit: Payer: Self-pay | Admitting: Family Medicine

## 2021-12-04 DIAGNOSIS — F331 Major depressive disorder, recurrent, moderate: Secondary | ICD-10-CM | POA: Diagnosis not present

## 2021-12-04 DIAGNOSIS — F411 Generalized anxiety disorder: Secondary | ICD-10-CM | POA: Diagnosis not present

## 2022-02-20 DIAGNOSIS — Y999 Unspecified external cause status: Secondary | ICD-10-CM | POA: Diagnosis not present

## 2022-02-20 DIAGNOSIS — M542 Cervicalgia: Secondary | ICD-10-CM | POA: Diagnosis not present

## 2022-02-20 DIAGNOSIS — M25511 Pain in right shoulder: Secondary | ICD-10-CM | POA: Diagnosis not present

## 2022-02-20 DIAGNOSIS — Y92410 Unspecified street and highway as the place of occurrence of the external cause: Secondary | ICD-10-CM | POA: Diagnosis not present

## 2022-02-20 DIAGNOSIS — M25561 Pain in right knee: Secondary | ICD-10-CM | POA: Diagnosis not present

## 2022-02-26 ENCOUNTER — Encounter: Payer: Self-pay | Admitting: Family Medicine

## 2022-02-26 ENCOUNTER — Ambulatory Visit (INDEPENDENT_AMBULATORY_CARE_PROVIDER_SITE_OTHER): Payer: BC Managed Care – PPO | Admitting: Family Medicine

## 2022-02-26 VITALS — BP 118/68 | HR 79 | Temp 98.1°F | Ht 64.0 in | Wt 178.2 lb

## 2022-02-26 DIAGNOSIS — M25511 Pain in right shoulder: Secondary | ICD-10-CM

## 2022-02-26 MED ORDER — TIZANIDINE HCL 4 MG PO TABS: 4.0000 mg | ORAL_TABLET | Freq: Four times a day (QID) | ORAL | 0 refills | Status: AC | PRN

## 2022-02-26 MED ORDER — TRAMADOL HCL 50 MG PO TABS: 50.0000 mg | ORAL_TABLET | Freq: Three times a day (TID) | ORAL | 0 refills | Status: AC | PRN

## 2022-02-26 NOTE — Patient Instructions (Addendum)
OK to take Tylenol 1000 mg (2 extra strength tabs) or 975 mg (3 regular strength tabs) every 6 hours as needed.  Ibuprofen 400-600 mg (2-3 over the counter strength tabs) every 6 hours as needed for pain.  Ice/cold pack over area for 10-15 min twice daily.  Heat (pad or rice pillow in microwave) over affected area, 10-15 minutes twice daily.   Do not drink alcohol, do any illicit/street drugs, drive or do anything that requires alertness while on this medicine.   Let us know if you need anything.  Trapezius stretches/exercises Do exercises exactly as told by your health care provider and adjust them as directed. It is normal to feel mild stretching, pulling, tightness, or discomfort as you do these exercises, but you should stop right away if you feel sudden pain or your pain gets worse.   Stretching and range of motion exercises These exercises warm up your muscles and joints and improve the movement and flexibility of your shoulder. These exercises can also help to relieve pain, numbness, and tingling. If you are unable to do any of the following for any reason, do not further attempt to do it.   Exercise A: Flexion, standing     Stand and hold a broomstick, a cane, or a similar object. Place your hands a little more than shoulder-width apart on the object. Your left / right hand should be palm-up, and your other hand should be palm-down. Push the stick to raise your left / right arm out to your side and then over your head. Use your other hand to help move the stick. Stop when you feel a stretch in your shoulder, or when you reach the angle that is recommended by your health care provider. Avoid shrugging your shoulder while you raise your arm. Keep your shoulder blade tucked down toward your spine. Hold for 30 seconds. Slowly return to the starting position. Repeat 2 times. Complete this exercise 3 times per week.  Exercise B: Abduction, supine     Lie on your back and hold a  broomstick, a cane, or a similar object. Place your hands a little more than shoulder-width apart on the object. Your left / right hand should be palm-up, and your other hand should be palm-down. Push the stick to raise your left / right arm out to your side and then over your head. Use your other hand to help move the stick. Stop when you feel a stretch in your shoulder, or when you reach the angle that is recommended by your health care provider. Avoid shrugging your shoulder while you raise your arm. Keep your shoulder blade tucked down toward your spine. Hold for 30 seconds. Slowly return to the starting position. Repeat 2 times. Complete this exercise 3 times per week.  Exercise C: Flexion, active-assisted     Lie on your back. You may bend your knees for comfort. Hold a broomstick, a cane, or a similar object. Place your hands about shoulder-width apart on the object. Your palms should face toward your feet. Raise the stick and move your arms over your head and behind your head, toward the floor. Use your healthy arm to help your left / right arm move farther. Stop when you feel a gentle stretch in your shoulder, or when you reach the angle where your health care provider tells you to stop. Hold for 30 seconds. Slowly return to the starting position. Repeat 2 times. Complete this exercise 3 times per week.  Exercise D:  External rotation and abduction     Stand in a door frame with one of your feet slightly in front of the other. This is called a staggered stance. Choose one of the following positions as told by your health care provider: Place your hands and forearms on the door frame above your head. Place your hands and forearms on the door frame at the height of your head. Place your hands on the door frame at the height of your elbows. Slowly move your weight onto your front foot until you feel a stretch across your chest and in the front of your shoulders. Keep your head and chest  upright and keep your abdominal muscles tight. Hold for 30 seconds. To release the stretch, shift your weight to your back foot. Repeat 2 times. Complete this stretch 3 times per week.  Strengthening exercises These exercises build strength and endurance in your shoulder. Endurance is the ability to use your muscles for a long time, even after your muscles get tired. Exercise E: Scapular depression and adduction  Sit on a stable chair. Support your arms in front of you with pillows, armrests, or a tabletop. Keep your elbows in line with the sides of your body. Gently move your shoulder blades down toward your middle back. Relax the muscles on the tops of your shoulders and in the back of your neck. Hold for 3 seconds. Slowly release the tension and relax your muscles completely before doing this exercise again. Repeat for a total of 10 repetitions. After you have practiced this exercise, try doing the exercise without the arm support. Then, try the exercise while standing instead of sitting. Repeat 2 times. Complete this exercise 3 times per week.  Exercise F: Shoulder abduction, isometric     Stand or sit about 4-6 inches (10-15 cm) from a wall with your left / right side facing the wall. Bend your left / right elbow and gently press your elbow against the wall. Increase the pressure slowly until you are pressing as hard as you can without shrugging your shoulder. Hold for 3 seconds. Slowly release the tension and relax your muscles completely. Repeat for a total of 10 repetitions. Repeat 2 times. Complete this exercise 3 times per week.  Exercise G: Shoulder flexion, isometric     Stand or sit about 4-6 inches (10-15 cm) away from a wall with your left / right side facing the wall. Keep your left / right elbow straight and gently press the top of your fist against the wall. Increase the pressure slowly until you are pressing as hard as you can without shrugging your shoulder. Hold for  10-15 seconds. Slowly release the tension and relax your muscles completely. Repeat for a total of 10 repetitions. Repeat 2 times. Complete this exercise 3 times per week.  Exercise H: Internal rotation     Sit in a stable chair without armrests, or stand. Secure an exercise band at your left / right side, at elbow height. Place a soft object, such as a folded towel or a small pillow, under your left / right upper arm so your elbow is a few inches (about 8 cm) away from your side. Hold the end of the exercise band so the band stretches. Keeping your elbow pressed against the soft object under your arm, move your forearm across your body toward your abdomen. Keep your body steady so the movement is only coming from your shoulder. Hold for 3 seconds. Slowly return to the starting  position. Repeat for a total of 10 repetitions. Repeat 2 times. Complete this exercise 3 times per week.  Exercise I: External rotation     Sit in a stable chair without armrests, or stand. Secure an exercise band at your left / right side, at elbow height. Place a soft object, such as a folded towel or a small pillow, under your left / right upper arm so your elbow is a few inches (about 8 cm) away from your side. Hold the end of the exercise band so the band stretches. Keeping your elbow pressed against the soft object under your arm, move your forearm out, away from your abdomen. Keep your body steady so the movement is only coming from your shoulder. Hold for 3 seconds. Slowly return to the starting position. Repeat for a total of 10 repetitions. Repeat 2 times. Complete this exercise 3 times per week. Exercise J: Shoulder extension  Sit in a stable chair without armrests, or stand. Secure an exercise band to a stable object in front of you so the band is at shoulder height. Hold one end of the exercise band in each hand. Your palms should face each other. Straighten your elbows and lift your hands up to  shoulder height. Step back, away from the secured end of the exercise band, until the band stretches. Squeeze your shoulder blades together and pull your hands down to the sides of your thighs. Stop when your hands are straight down by your sides. Do not let your hands go behind your body. Hold for 3 seconds. Slowly return to the starting position. Repeat for a total of 10 repetitions. Repeat 2 times. Complete this exercise 3 times per week.  Exercise K: Shoulder extension, prone     Lie on your abdomen on a firm surface so your left / right arm hangs over the edge. Hold a 5 lb weight in your hand so your palm faces in toward your body. Your arm should be straight. Squeeze your shoulder blade down toward the middle of your back. Slowly raise your arm behind you, up to the height of the surface that you are lying on. Keep your arm straight. Hold for 3 seconds. Slowly return to the starting position and relax your muscles. Repeat for a total of 10 repetitions. Repeat 2 times. Complete this exercise 3 times per week.   Exercise L: Horizontal abduction, prone  Lie on your abdomen on a firm surface so your left / right arm hangs over the edge. Hold a 5 lb weight in your hand so your palm faces toward your feet. Your arm should be straight. Squeeze your shoulder blade down toward the middle of your back. Bend your elbow so your hand moves up, until your elbow is bent to an "L" shape (90 degrees). With your elbow bent, slowly move your forearm forward and up. Raise your hand up to the height of the surface that you are lying on. Your upper arm should not move, and your elbow should stay bent. At the top of the movement, your palm should face the floor. Hold for 3 seconds. Slowly return to the starting position and relax your muscles. Repeat for a total of 10 repetitions. Repeat 2 times. Complete this exercise 3 times per week.  Exercise M: Horizontal abduction, standing  Sit on a stable chair,  or stand. Secure an exercise band to a stable object in front of you so the band is at shoulder height. Hold one end of the  exercise band in each hand. Straighten your elbows and lift your hands straight in front of you, up to shoulder height. Your palms should face down, toward the floor. Step back, away from the secured end of the exercise band, until the band stretches. Move your arms out to your sides, and keep your arms straight. Hold for 3 seconds. Slowly return to the starting position. Repeat for a total of 10 repetitions. Repeat 2 times. Complete this exercise 3 times per week.  Exercise N: Scapular retraction and elevation  Sit on a stable chair, or stand. Secure an exercise band to a stable object in front of you so the band is at shoulder height. Hold one end of the exercise band in each hand. Your palms should face each other. Sit in a stable chair without armrests, or stand. Step back, away from the secured end of the exercise band, until the band stretches. Squeeze your shoulder blades together and lift your hands over your head. Keep your elbows straight. Hold for 3 seconds. Slowly return to the starting position. Repeat for a total of 10 repetitions. Repeat 2 times. Complete this exercise 3 times per week.  This information is not intended to replace advice given to you by your health care provider. Make sure you discuss any questions you have with your health care provider. Document Released: 05/31/2005 Document Revised: 02/05/2016 Document Reviewed: 04/17/2015 Elsevier Interactive Patient Education  2017 [place] (ROM) AND STRETCHING EXERCISES These exercises may help you when beginning to rehabilitate your injury. While completing these exercises, remember:  Restoring tissue flexibility helps normal motion to return to the joints. This allows healthier, less painful movement and activity. An effective stretch should be held for at  least 30 seconds. A stretch should never be painful. You should only feel a gentle lengthening or release in the stretched tissue.  ROM - Pendulum Bend at the waist so that your right / left arm falls away from your body. Support yourself with your opposite hand on a solid surface, such as a table or a countertop. Your right / left arm should be perpendicular to the ground. If it is not perpendicular, you need to lean over farther. Relax the muscles in your right / left arm and shoulder as much as possible. Gently sway your hips and trunk so they move your right / left arm without any use of your right / left shoulder muscles. Progress your movements so that your right / left arm moves side to side, then forward and backward, and finally, both clockwise and counterclockwise. Complete 10-15 repetitions in each direction. Many people use this exercise to relieve discomfort in their shoulder as well as to gain range of motion. Repeat 2 times. Complete this exercise 3 times per week.  STRETCH - Flexion, Standing Stand with good posture. With an underhand grip on your right / left hand and an overhand grip on the opposite hand, grasp a broomstick or cane so that your hands are a little more than shoulder-width apart. Keeping your right / left elbow straight and shoulder muscles relaxed, push the stick with your opposite hand to raise your right / left arm in front of your body and then overhead. Raise your arm until you feel a stretch in your right / left shoulder, but before you have increased shoulder pain. Try to avoid shrugging your right / left shoulder as your arm rises by keeping your shoulder blade tucked down and toward  your mid-back spine. Hold 30 seconds. Slowly return to the starting position. Repeat 2 times. Complete this exercise 3 times per week.  STRETCH - Internal Rotation Place your right / left hand behind your back, palm-up. Throw a towel or belt over your opposite shoulder. Grasp  the towel/belt with your right / left hand. While keeping an upright posture, gently pull up on the towel/belt until you feel a stretch in the front of your right / left shoulder. Avoid shrugging your right / left shoulder as your arm rises by keeping your shoulder blade tucked down and toward your mid-back spine. Hold 30. Release the stretch by lowering your opposite hand. Repeat 2 times. Complete this exercise 3 times per week.  STRETCH - External Rotation and Abduction Stagger your stance through a doorframe. It does not matter which foot is forward. As instructed by your physician, physical therapist or athletic trainer, place your hands: And forearms above your head and on the door frame. And forearms at head-height and on the door frame. At elbow-height and on the door frame. Keeping your head and chest upright and your stomach muscles tight to prevent over-extending your low-back, slowly shift your weight onto your front foot until you feel a stretch across your chest and/or in the front of your shoulders. Hold 30 seconds. Shift your weight to your back foot to release the stretch. Repeat 2 times. Complete this stretch 3 times per week.   STRENGTHENING EXERCISES  These exercises may help you when beginning to rehabilitate your injury. They may resolve your symptoms with or without further involvement from your physician, physical therapist or athletic trainer. While completing these exercises, remember:  Muscles can gain both the endurance and the strength needed for everyday activities through controlled exercises. Complete these exercises as instructed by your physician, physical therapist or athletic trainer. Progress the resistance and repetitions only as guided. You may experience muscle soreness or fatigue, but the pain or discomfort you are trying to eliminate should never worsen during these exercises. If this pain does worsen, stop and make certain you are following the directions  exactly. If the pain is still present after adjustments, discontinue the exercise until you can discuss the trouble with your clinician. If advised by your physician, during your recovery, avoid activity or exercises which involve actions that place your right / left hand or elbow above your head or behind your back or head. These positions stress the tissues which are trying to heal.  STRENGTH - Scapular Depression and Adduction With good posture, sit on a firm chair. Supported your arms in front of you with pillows, arm rests or a table top. Have your elbows in line with the sides of your body. Gently draw your shoulder blades down and toward your mid-back spine. Gradually increase the tension without tensing the muscles along the top of your shoulders and the back of your neck. Hold for 3 seconds. Slowly release the tension and relax your muscles completely before completing the next repetition. After you have practiced this exercise, remove the arm support and complete it in standing as well as sitting. Repeat 2 times. Complete this exercise 3 times per week.   STRENGTH - External Rotators Secure a rubber exercise band/tubing to a fixed object so that it is at the same height as your right / left elbow when you are standing or sitting on a firm surface. Stand or sit so that the secured exercise band/tubing is at your side that is not  injured. Bend your elbow 90 degrees. Place a folded towel or small pillow under your right / left arm so that your elbow is a few inches away from your side. Keeping the tension on the exercise band/tubing, pull it away from your body, as if pivoting on your elbow. Be sure to keep your body steady so that the movement is only coming from your shoulder rotating. Hold 3 seconds. Release the tension in a controlled manner as you return to the starting position. Repeat 2 times. Complete this exercise 3 times per week.   STRENGTH - Supraspinatus Stand or sit with good  posture. Grasp a 2-3 lb weight or an exercise band/tubing so that your hand is "thumbs-up," like when you shake hands. Slowly lift your right / left hand from your thigh into the air, traveling about 30 degrees from straight out at your side. Lift your hand to shoulder height or as far as you can without increasing any shoulder pain. Initially, many people do not lift their hands above shoulder height. Avoid shrugging your right / left shoulder as your arm rises by keeping your shoulder blade tucked down and toward your mid-back spine. Hold for 3 seconds. Control the descent of your hand as you slowly return to your starting position. Repeat 2 times. Complete this exercise 3 times per week.   STRENGTH - Shoulder Extensors Secure a rubber exercise band/tubing so that it is at the height of your shoulders when you are either standing or sitting on a firm arm-less chair. With a thumbs-up grip, grasp an end of the band/tubing in each hand. Straighten your elbows and lift your hands straight in front of you at shoulder height. Step back away from the secured end of band/tubing until it becomes tense. Squeezing your shoulder blades together, pull your hands down to the sides of your thighs. Do not allow your hands to go behind you. Hold for 3 seconds. Slowly ease the tension on the band/tubing as you reverse the directions and return to the starting position. Repeat 2 times. Complete this exercise 3 times per week.   STRENGTH - Scapular Retractors Secure a rubber exercise band/tubing so that it is at the height of your shoulders when you are either standing or sitting on a firm arm-less chair. With a palm-down grip, grasp an end of the band/tubing in each hand. Straighten your elbows and lift your hands straight in front of you at shoulder height. Step back away from the secured end of band/tubing until it becomes tense. Squeezing your shoulder blades together, draw your elbows back as you bend them. Keep  your upper arm lifted away from your body throughout the exercise. Hold 3 seconds. Slowly ease the tension on the band/tubing as you reverse the directions and return to the starting position. Repeat 2 times. Complete this exercise 3 times per week.  STRENGTH - Scapular Depressors Find a sturdy chair without wheels, such as a from a dining room table. Keeping your feet on the floor, lift your bottom from the seat and lock your elbows. Keeping your elbows straight, allow gravity to pull your body weight down. Your shoulders will rise toward your ears. Raise your body against gravity by drawing your shoulder blades down your back, shortening the distance between your shoulders and ears. Although your feet should always maintain contact with the floor, your feet should progressively support less body weight as you get stronger. Hold 3 seconds. In a controlled and slow manner, lower your body weight  to begin the next repetition. Repeat 2 times. Complete this exercise 3 times per week.    This information is not intended to replace advice given to you by your health care provider. Make sure you discuss any questions you have with your health care provider.   Document Released: 04/14/2005 Document Revised: 06/21/2014 Document Reviewed: 09/12/2008 Elsevier Interactive Patient Education [employer].
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
## 2022-03-03 ENCOUNTER — Ambulatory Visit (INDEPENDENT_AMBULATORY_CARE_PROVIDER_SITE_OTHER): Payer: BC Managed Care – PPO | Admitting: Sports Medicine

## 2022-03-03 ENCOUNTER — Ambulatory Visit: Payer: BC Managed Care – PPO

## 2022-03-03 VITALS — BP 106/80 | HR 83 | Ht 64.0 in | Wt 178.0 lb

## 2022-03-03 DIAGNOSIS — M25511 Pain in right shoulder: Secondary | ICD-10-CM

## 2022-03-03 DIAGNOSIS — M7551 Bursitis of right shoulder: Secondary | ICD-10-CM
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
## 2022-03-03 NOTE — Patient Instructions (Addendum)
Good to see you  Shoulder ROM  3-4 week follow up
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
## 2022-03-24 ENCOUNTER — Ambulatory Visit: Payer: BC Managed Care – PPO

## 2022-03-24 ENCOUNTER — Ambulatory Visit (INDEPENDENT_AMBULATORY_CARE_PROVIDER_SITE_OTHER): Payer: BC Managed Care – PPO | Admitting: Sports Medicine

## 2022-03-24 VITALS — BP 120/84 | HR 87 | Ht 64.0 in | Wt 175.0 lb

## 2022-03-24 DIAGNOSIS — M25511 Pain in right shoulder: Secondary | ICD-10-CM

## 2022-03-24 DIAGNOSIS — R29898 Other symptoms and signs involving the musculoskeletal system: Secondary | ICD-10-CM

## 2022-03-24 DIAGNOSIS — M7551 Bursitis of right shoulder: Secondary | ICD-10-CM | POA: Diagnosis not present

## 2022-03-24 MED ORDER — MELOXICAM 15 MG PO TABS: 15.0000 mg | ORAL_TABLET | Freq: Every day | ORAL | 0 refills | Status: AC

## 2022-03-24 NOTE — Patient Instructions (Addendum)
Good to see you  - Start meloxicam 15 mg daily x2 weeks.  If still having pain after 2 weeks, complete 3rd-week of meloxicam. May use remaining meloxicam as needed once daily for pain control.  Do not to use additional NSAIDs while taking meloxicam.  May use Tylenol [phone] mg 2 to 3 times a day for breakthrough pain. Continue HEP  MRI w contrast right shoulder  Follow up 3 days after MRI to discuss results

## 2022-04-06 ENCOUNTER — Ambulatory Visit (INDEPENDENT_AMBULATORY_CARE_PROVIDER_SITE_OTHER): Payer: BC Managed Care – PPO | Admitting: Sports Medicine

## 2022-04-06 ENCOUNTER — Ambulatory Visit (INDEPENDENT_AMBULATORY_CARE_PROVIDER_SITE_OTHER): Payer: BC Managed Care – PPO

## 2022-04-06 DIAGNOSIS — G8929 Other chronic pain: Secondary | ICD-10-CM | POA: Diagnosis not present

## 2022-04-06 DIAGNOSIS — M7551 Bursitis of right shoulder: Secondary | ICD-10-CM

## 2022-04-06 DIAGNOSIS — M25511 Pain in right shoulder: Secondary | ICD-10-CM | POA: Diagnosis not present

## 2022-04-06 DIAGNOSIS — R29898 Other symptoms and signs involving the musculoskeletal system: Secondary | ICD-10-CM

## 2022-04-06 MED ORDER — GADOBUTROL 1 MMOL/ML IV SOLN
1.0000 mL | Freq: Once | INTRAVENOUS | Status: AC | PRN
  Administered 2022-04-06: 1 mL

## 2022-04-06 NOTE — Assessment & Plan Note (Signed)
Injection for MR arthrography, further management per primary treating provider. 

## 2022-04-06 NOTE — Progress Notes (Signed)
    Procedures performed today:    Procedure: Real-time Ultrasound Guided gadolinium contrast injection of right glenohumeral joint Device: Samsung HS60  Verbal informed consent obtained.  Time-out conducted.  Noted no overlying erythema, induration, or other signs of local infection.  Skin prepped in a sterile fashion.  Local anesthesia: Topical Ethyl chloride.  With sterile technique and under real time ultrasound guidance: Noted mild effusion, 1 cc kenalog 40, 2 cc lidocaine, 2 cc bupivacaine injected easily, syringe switched and 0.1 cc gadolinium injected, syringe again switched and 10 cc sterile saline used to fully distend the joint. Joint visualized and capsule seen distending confirming intra-articular placement of contrast material and medication. Completed without difficulty  Advised to call if fevers/chills, erythema, induration, drainage, or persistent bleeding.  Images permanently stored in PACS Impression: Technically successful ultrasound guided gadolinium contrast injection for MR arthrography.  Please see separate MR arthrogram report.  Independent interpretation of notes and tests performed by another provider:   None.  Brief History, Exam, Impression, and Recommendations:    Chronic right shoulder pain Injection for MR arthrography, further management per primary treating provider.    ____________________________________________ Gwen Her. Dianah Field, M.D., ABFM., CAQSM., AME. Primary Care and Sports Medicine [facility]  Adjunct Professor of [employer]  Risk manager
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
## 2022-04-09 ENCOUNTER — Ambulatory Visit (INDEPENDENT_AMBULATORY_CARE_PROVIDER_SITE_OTHER): Payer: BC Managed Care – PPO | Admitting: Sports Medicine

## 2022-04-09 VITALS — BP 128/82 | HR 64 | Ht 64.0 in | Wt 175.0 lb

## 2022-04-09 DIAGNOSIS — M7551 Bursitis of right shoulder: Secondary | ICD-10-CM | POA: Diagnosis not present

## 2022-04-09 DIAGNOSIS — M25511 Pain in right shoulder: Secondary | ICD-10-CM

## 2022-04-09 NOTE — Patient Instructions (Addendum)
Good to see you Pt referral  Continue HEP  Discontinue daily meloxicam use remainder as needed Tylenol [phone] mg 2-3 times a day for pain relief  4 week follow up
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
## 2022-05-04 ENCOUNTER — Ambulatory Visit (INDEPENDENT_AMBULATORY_CARE_PROVIDER_SITE_OTHER): Payer: BC Managed Care – PPO | Admitting: Sports Medicine

## 2022-05-04 VITALS — BP 122/78 | HR 78 | Ht 64.0 in | Wt 180.0 lb

## 2022-05-04 DIAGNOSIS — M7551 Bursitis of right shoulder: Secondary | ICD-10-CM

## 2022-05-04 DIAGNOSIS — M25511 Pain in right shoulder: Secondary | ICD-10-CM | POA: Diagnosis not present

## 2022-05-04 NOTE — Patient Instructions (Signed)
Good to see you   

## 2023-03-19 LAB — HM MAMMOGRAPHY

## 2024-02-07 ENCOUNTER — Encounter: Payer: Self-pay | Admitting: Family Medicine

## 2024-02-07 ENCOUNTER — Other Ambulatory Visit: Payer: Self-pay

## 2024-02-14 ENCOUNTER — Encounter: Payer: Self-pay | Admitting: Sports Medicine
